# Patient Record
Sex: Male | Born: 1965 | Race: Black or African American | Hispanic: No | Smoking: Never smoker
Health system: Southern US, Community
[De-identification: ages and names within clinical notes are randomized; demographics above are authoritative.]

## PROBLEM LIST (undated history)

## (undated) DIAGNOSIS — R413 Other amnesia: Secondary | ICD-10-CM

## (undated) DIAGNOSIS — R030 Elevated blood-pressure reading, without diagnosis of hypertension: Secondary | ICD-10-CM

## (undated) DIAGNOSIS — I10 Essential (primary) hypertension: Secondary | ICD-10-CM

## (undated) DIAGNOSIS — T7840XA Allergy, unspecified, initial encounter: Secondary | ICD-10-CM

## (undated) DIAGNOSIS — H04123 Dry eye syndrome of bilateral lacrimal glands: Secondary | ICD-10-CM

## (undated) DIAGNOSIS — E538 Deficiency of other specified B group vitamins: Secondary | ICD-10-CM

## (undated) DIAGNOSIS — R001 Bradycardia, unspecified: Secondary | ICD-10-CM

## (undated) DIAGNOSIS — E039 Hypothyroidism, unspecified: Secondary | ICD-10-CM

## (undated) DIAGNOSIS — E785 Hyperlipidemia, unspecified: Secondary | ICD-10-CM

## (undated) DIAGNOSIS — D649 Anemia, unspecified: Secondary | ICD-10-CM

## (undated) DIAGNOSIS — J029 Acute pharyngitis, unspecified: Secondary | ICD-10-CM

## (undated) DIAGNOSIS — E509 Vitamin A deficiency, unspecified: Secondary | ICD-10-CM

## (undated) HISTORY — DX: Bradycardia, unspecified: R00.1

## (undated) HISTORY — DX: Allergy, unspecified, initial encounter: T78.40XA

## (undated) HISTORY — DX: Other amnesia: R41.3

## (undated) HISTORY — DX: Hypothyroidism, unspecified: E03.9

## (undated) HISTORY — PX: BACK SURGERY: SHX140

## (undated) HISTORY — DX: Vitamin a deficiency, unspecified: E50.9

## (undated) HISTORY — DX: Elevated blood-pressure reading, without diagnosis of hypertension: R03.0

## (undated) HISTORY — DX: Deficiency of other specified B group vitamins: E53.8

## (undated) HISTORY — DX: Anemia, unspecified: D64.9

## (undated) HISTORY — DX: Hyperlipidemia, unspecified: E78.5

## (undated) HISTORY — DX: Acute pharyngitis, unspecified: J02.9

## (undated) HISTORY — DX: Dry eye syndrome of bilateral lacrimal glands: H04.123

---

## 1998-03-31 ENCOUNTER — Encounter: Payer: Self-pay | Admitting: Emergency Medicine

## 1998-03-31 ENCOUNTER — Emergency Department (HOSPITAL_COMMUNITY): Admission: EM | Admit: 1998-03-31 | Discharge: 1998-03-31 | Payer: Self-pay | Admitting: Emergency Medicine

## 1998-04-01 ENCOUNTER — Emergency Department (HOSPITAL_COMMUNITY): Admission: EM | Admit: 1998-04-01 | Discharge: 1998-04-01 | Payer: Self-pay | Admitting: Internal Medicine

## 1998-04-04 ENCOUNTER — Emergency Department (HOSPITAL_COMMUNITY): Admission: EM | Admit: 1998-04-04 | Discharge: 1998-04-04 | Payer: Self-pay | Admitting: Emergency Medicine

## 1999-03-19 ENCOUNTER — Emergency Department (HOSPITAL_COMMUNITY): Admission: EM | Admit: 1999-03-19 | Discharge: 1999-03-19 | Payer: Self-pay | Admitting: Emergency Medicine

## 1999-03-24 ENCOUNTER — Emergency Department (HOSPITAL_COMMUNITY): Admission: EM | Admit: 1999-03-24 | Discharge: 1999-03-24 | Payer: Self-pay | Admitting: Emergency Medicine

## 1999-09-01 ENCOUNTER — Ambulatory Visit (HOSPITAL_COMMUNITY): Admission: RE | Admit: 1999-09-01 | Discharge: 1999-09-01 | Payer: Self-pay | Admitting: *Deleted

## 1999-10-23 ENCOUNTER — Emergency Department (HOSPITAL_COMMUNITY): Admission: EM | Admit: 1999-10-23 | Discharge: 1999-10-23 | Payer: Self-pay | Admitting: *Deleted

## 1999-11-13 ENCOUNTER — Emergency Department (HOSPITAL_COMMUNITY): Admission: EM | Admit: 1999-11-13 | Discharge: 1999-11-13 | Payer: Self-pay | Admitting: Emergency Medicine

## 2008-01-15 ENCOUNTER — Ambulatory Visit (HOSPITAL_COMMUNITY): Admission: RE | Admit: 2008-01-15 | Discharge: 2008-01-16 | Payer: Self-pay | Admitting: Neurosurgery

## 2009-06-23 ENCOUNTER — Encounter: Admission: RE | Admit: 2009-06-23 | Discharge: 2009-06-23 | Payer: Self-pay | Admitting: Internal Medicine

## 2010-06-13 NOTE — Op Note (Signed)
Steven Morales, Steven Morales NO.:  [id number]   MEDICAL RECORD NO.:  [id number]          PATIENT TYPE:  OIB   LOCATION:  3523                         FACILITY:  [facility]   PHYSICIAN:  Reinaldo Meeker, M.D. DATE OF BIRTH:  02-04-1965   DATE OF PROCEDURE:  01/15/2008  DATE OF DISCHARGE:                               OPERATIVE REPORT   PREOPERATIVE DIAGNOSIS:  Herniated disk L4-L5, right.   POSTOPERATIVE DIAGNOSIS:  Herniated disk L4-L5, right.   PROCEDURE:  Right L4-L5 intralaminar laminotomy for excision of  herniated disk with operative microscope.   SECONDARY PROCEDURE:  Microdissection L4-L5 disk and L5 nerve roots.   SURGEON:  Reinaldo Meeker, MD   ASSISTANT:  Tia Alert, MD   PROCEDURE IN DETAIL:  After placing in the prone position, the patient's  back was prepped and draped in the usual sterile fashion.  Localizing x-  rays taken prior to incision to identify the appropriate level.  A  midline incision was made above the spinous processes of L4 and L5.  Using Bovie cutting current, an incision was carried to the spinous  processes.  Subperiosteal dissection was then carried out on the right  side of the spinous processes and lamina.  Self-retaining retractor was  placed for exposure.  X-ray showed we were at the appropriate level.  Using high speed drill, the inferior one-third of the L4 lamina, the  medial one-third of facet joint and superior one-half of the L5 lamina  were removed.  Residual bone and ligamentum flavum were removed in a  piecemeal fashion.  The microscope was draped and brought into the field  and used for remainder of the case.  Using microdissection technique,  the lateral aspect of the thecal sac and L5 nerve were identified.  Bipolar coagulation was carried out down the  canal to identify the L4-  L5 disk, which was found to be markedly herniated with a sublingamentous  inferior fragment.  The fragment was additionally removed and  the  disk  space incised with a 15 blade.  Using pituitary rongeurs and curettes,  thorough disk space clean-up was carried out __________.  Great care was  taken to avoid injury to the neural elements.  This was successfully  done.  At this time, inspection was carried out in all directions  without any evidence of residual compression and none could be  identified.  Large amounts of irrigation was carried out and any  bleeding controlled with bipolar coagulation and Gelfoam.  The wound was  then closed with multiple layers of Vicryl in the muscle fascia,  subcutaneous subcuticular tissues, and staples were placed on the skin.  A sterile dressing was then applied, and the patient was extubated and  taken to recovery room in stable condition.           ______________________________  Reinaldo Meeker, M.D.     ROK/MEDQ  D:  01/15/2008  T:  01/16/2008  Job:  [id number]

## 2010-06-16 NOTE — Op Note (Signed)
[place]  Patient:    Steven Morales, Steven Morales                       MRN: [id number] Proc. Date: 09/01/99 Adm. Date:  [id number] Disc. Date: [id number] Attending:  Sharyn Dross                           Operative Report  PREOPERATIVE DIAGNOSIS:  Dysphagia.  POSTOPERATIVE DIAGNOSIS: 1. Diffuse gastritis appreciated. 2. Hyperemia of the esophagus but no other abnormalities noted.  PROCEDURE PERFORMED:  Esophagogastroduodenoscopy.  MEDICATIONS USED:  Demerol 50 mg IV, Versed 5 mg IV over a 10-minute period of time.  INSTRUMENT USED:  Olympus video panendoscope.  ENDOSCOPIST:  Sharyn Dross., M.D.  INDICATIONS FOR PROCEDURE:  This pleasant 45 year old African-American male was referred, came to the office for evaluation of difficulty swallowing.  He was initially placed on a proton pump inhibitor medication which appears to have helped.  The patient subsequently stopped the PPI medication with noticed recurrence of his symptoms at this time.  The patient now states that his difficulties with swallowing is high up in the chest region at this time. There is no history of any hematemesis, melena or hematochezias noted.  OBJECTIVE FINDINGS:  He is a pleasant gentleman who appears to be in no acute distress today.  His vital signs are stable.  His HEENT examination is anicteric.  Neck was supple.  Lungs were clear.  The heart had a regular rate and rhythm without heaves, thrills, murmurs or gallops.  The abdomen was soft with no tenderness to palpation, no hepatosplenomegaly that was noted. Digital rectal exam was deferred.  Extremities showed no clubbing, cyanosis or edema.  PLAN:  I am going to proceed with the endoscopic examination especially in light of the patients having recurrent symptoms soon after he stops his PPI at this time depending on the results to determine the course of therapy.  INFORMED CONSENT:  The patient was advised of the  procedure, indications, and the reason for wanting to do the procedure at this time.  The patient has agreed to have the procedure performed at this time.  A video was reviewed and the consent form was obtained.  PREOPERATIVE PREPARATION:  The patient was brought to the endoscopy unit where an IV for IV conscious sedating medication was started.  A monitor was placed on the patient to monitor the patients vital signs and oxygen saturation. Nasal oxygen at 2 L per minute was used and after adequate sedation was performed, the procedure was begun.  DESCRIPTION OF PROCEDURE:  The instrument was advanced with the patient in the left lateral position via the direct technique without difficulty.  The oropharyngeal epliglottis, vocal cords and piriform sinuses appeared to be grossly within normal limits. The esophagus was normal without any evidence of acute inflammation, ulcerations, hiatal hernias or varices appreciated.  The gastric area showed a normal mucous lake with evidence of diffuse gastritis changes that was present at this time.  The mucosa appeared to be hyperemic, possibly secondary to video technology from that standpoint. However, there were linear streaks that was present in the antral area which were more reddened than the normal contour that was present.  The instrument was attempted to advance into the pylorus with initially some unsuccessfulness that was noted.  Upon continued attempts, the mucosal area that was noted in the adjacent region appeared to be very  friable that was noted.  The instrument was subsequently advanced into the pylorus and the duodenal bulb and second portion appeared to be within normal limits.  Retroflex view of the cardia showed still evidence of inflammation that was noted throughout the region.  The Z-line appeared to be approximately 40 cm.  The instrument was retracted back and removed per orum without any abnormalities noted.  TREATMENT: 1.  I am going to start the patient back with his Aciphex 20 mg on a daily    basis at this time. 2. Will have follow-up in the office with me. 3. Biopsies were not obtained at this time due to the patients slight    agitation and the process and difficulty in maintaining him from this    standpoint. 4. If the patient still shows evidence of difficulty in responding, may need    to add either a prokinetic for increased emptying or a medication to help    by coating the inflammatory process that was noted.  Depending on the    results to determine the course of therapy. DD:  09/01/99 TD:  09/04/99 Job: [id number]

## 2010-11-03 LAB — BASIC METABOLIC PANEL
BUN: 8 mg/dL (ref 6–23)
CO2: 27 mEq/L (ref 19–32)
Calcium: 9.6 mg/dL (ref 8.4–10.5)
Chloride: 104 mEq/L (ref 96–112)
Creatinine, Ser: 0.83 mg/dL (ref 0.4–1.5)
GFR calc Af Amer: >60 mL/min (ref >60)
GFR calc non Af Amer: >60 mL/min (ref >60)
Glucose, Bld: 92 mg/dL (ref 70–99)
Potassium: 4.9 mEq/L (ref 3.5–5.1)
Sodium: 138 mEq/L (ref 135–145)

## 2010-11-03 LAB — CBC
HCT: 43 % (ref 39.0–52.0)
Platelets: 237 10*3/uL (ref 150–400)
RDW: 12.4 % (ref 11.5–15.5)

## 2011-01-25 ENCOUNTER — Ambulatory Visit: Payer: Self-pay

## 2011-01-25 DIAGNOSIS — R21 Rash and other nonspecific skin eruption: Secondary | ICD-10-CM

## 2011-01-25 DIAGNOSIS — Z23 Encounter for immunization: Secondary | ICD-10-CM

## 2011-01-25 DIAGNOSIS — Z0000 Encounter for general adult medical examination without abnormal findings: Secondary | ICD-10-CM

## 2011-04-13 ENCOUNTER — Telehealth: Payer: Self-pay

## 2011-04-13 DIAGNOSIS — E0590 Thyrotoxicosis, unspecified without thyrotoxic crisis or storm: Secondary | ICD-10-CM

## 2011-04-13 NOTE — Telephone Encounter (Signed)
Pt was going to be referred to [facility] but he did not have the money at the time he does have the  Money now and would like to go ahead with the referral

## 2011-04-14 NOTE — Telephone Encounter (Signed)
Spoke with pt and advised that Dr would have to review first and then we would get back in touch with him. Chart is in [employer].

## 2011-07-04 ENCOUNTER — Other Ambulatory Visit (HOSPITAL_COMMUNITY): Payer: Self-pay | Admitting: Endocrinology

## 2011-07-04 DIAGNOSIS — E0590 Thyrotoxicosis, unspecified without thyrotoxic crisis or storm: Secondary | ICD-10-CM

## 2011-07-05 ENCOUNTER — Other Ambulatory Visit (HOSPITAL_COMMUNITY): Payer: Self-pay | Admitting: Endocrinology

## 2011-07-05 DIAGNOSIS — E0590 Thyrotoxicosis, unspecified without thyrotoxic crisis or storm: Secondary | ICD-10-CM

## 2011-07-10 ENCOUNTER — Ambulatory Visit (HOSPITAL_COMMUNITY): Payer: Self-pay

## 2011-07-11 ENCOUNTER — Other Ambulatory Visit (HOSPITAL_COMMUNITY): Payer: Self-pay

## 2011-07-12 ENCOUNTER — Encounter (HOSPITAL_COMMUNITY)
Admission: RE | Admit: 2011-07-12 | Discharge: 2011-07-12 | Disposition: A | Discharge status: Home / Self Care | Payer: Self-pay | Source: Ambulatory Visit | Attending: Endocrinology | Admitting: Endocrinology

## 2011-07-12 DIAGNOSIS — E0590 Thyrotoxicosis, unspecified without thyrotoxic crisis or storm: Secondary | ICD-10-CM | POA: Insufficient documentation

## 2011-07-13 ENCOUNTER — Encounter (HOSPITAL_COMMUNITY)
Admission: RE | Admit: 2011-07-13 | Discharge: 2011-07-13 | Disposition: A | Discharge status: Home / Self Care | Payer: Self-pay | Source: Ambulatory Visit | Attending: Endocrinology | Admitting: Endocrinology

## 2011-07-13 MED ORDER — SODIUM IODIDE I 131 CAPSULE
13.0300 | Freq: Once | INTRAVENOUS | Status: AC | PRN
  Administered 2011-07-12: 13.03 via ORAL

## 2011-07-13 MED ORDER — SODIUM PERTECHNETATE TC 99M INJECTION
10.0000 | Freq: Once | INTRAVENOUS | Status: AC | PRN
  Administered 2011-07-13: 10 via INTRAVENOUS

## 2011-07-20 ENCOUNTER — Encounter (HOSPITAL_COMMUNITY)
Admission: RE | Admit: 2011-07-20 | Discharge: 2011-07-20 | Disposition: A | Discharge status: Home / Self Care | Payer: Self-pay | Source: Ambulatory Visit | Attending: Endocrinology | Admitting: Endocrinology

## 2011-07-20 DIAGNOSIS — E0590 Thyrotoxicosis, unspecified without thyrotoxic crisis or storm: Secondary | ICD-10-CM | POA: Insufficient documentation

## 2011-07-20 MED ORDER — SODIUM IODIDE I 131 CAPSULE
21.4000 | Freq: Once | INTRAVENOUS | Status: AC | PRN
  Administered 2011-07-20: 21.4 via ORAL

## 2012-03-17 ENCOUNTER — Emergency Department (HOSPITAL_COMMUNITY): Payer: 59

## 2012-03-17 ENCOUNTER — Encounter (HOSPITAL_COMMUNITY): Payer: Self-pay | Admitting: Emergency Medicine

## 2012-03-17 ENCOUNTER — Emergency Department (HOSPITAL_COMMUNITY)
Admission: EM | Admit: 2012-03-17 | Discharge: 2012-03-17 | Disposition: A | Discharge status: Home / Self Care | Payer: 59 | Attending: Emergency Medicine | Admitting: Emergency Medicine

## 2012-03-17 DIAGNOSIS — S161XXA Strain of muscle, fascia and tendon at neck level, initial encounter: Secondary | ICD-10-CM

## 2012-03-17 DIAGNOSIS — S39012A Strain of muscle, fascia and tendon of lower back, initial encounter: Secondary | ICD-10-CM

## 2012-03-17 DIAGNOSIS — S139XXA Sprain of joints and ligaments of unspecified parts of neck, initial encounter: Secondary | ICD-10-CM | POA: Insufficient documentation

## 2012-03-17 DIAGNOSIS — I10 Essential (primary) hypertension: Secondary | ICD-10-CM | POA: Insufficient documentation

## 2012-03-17 DIAGNOSIS — Y9389 Activity, other specified: Secondary | ICD-10-CM | POA: Insufficient documentation

## 2012-03-17 DIAGNOSIS — S335XXA Sprain of ligaments of lumbar spine, initial encounter: Secondary | ICD-10-CM | POA: Insufficient documentation

## 2012-03-17 DIAGNOSIS — Y92410 Unspecified street and highway as the place of occurrence of the external cause: Secondary | ICD-10-CM | POA: Insufficient documentation

## 2012-03-17 HISTORY — DX: Essential (primary) hypertension: I10

## 2012-03-17 MED ORDER — CYCLOBENZAPRINE HCL 10 MG PO TABS: 10.0000 mg | ORAL_TABLET | Freq: Three times a day (TID) | ORAL | Status: DC | PRN

## 2012-03-17 MED ORDER — IBUPROFEN 800 MG PO TABS: 800.0000 mg | ORAL_TABLET | Freq: Three times a day (TID) | ORAL | Status: DC | PRN

## 2012-03-17 MED ORDER — IBUPROFEN 400 MG PO TABS
800.0000 mg | ORAL_TABLET | Freq: Once | ORAL | Status: AC
  Administered 2012-03-17: 800 mg via ORAL
  Filled 2012-03-17: qty 2
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
## 2012-03-17 NOTE — ED Notes (Signed)
Pt transported to radiology.
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
## 2012-03-17 NOTE — ED Notes (Signed)
Pt returned from radiology.

## 2012-03-17 NOTE — ED Notes (Signed)
C-collar applied in triage.
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
## 2013-02-23 ENCOUNTER — Ambulatory Visit: Payer: 59

## 2013-02-23 ENCOUNTER — Ambulatory Visit (INDEPENDENT_AMBULATORY_CARE_PROVIDER_SITE_OTHER): Payer: 59 | Admitting: Emergency Medicine

## 2013-02-23 VITALS — BP 138/80 | HR 55 | Temp 98.1°F | Resp 16 | Ht 69.75 in | Wt 182.0 lb

## 2013-02-23 DIAGNOSIS — M549 Dorsalgia, unspecified: Secondary | ICD-10-CM

## 2013-02-23 MED ORDER — MELOXICAM 7.5 MG PO TABS: ORAL_TABLET | ORAL | Status: DC
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
## 2013-02-27 ENCOUNTER — Telehealth: Payer: Self-pay

## 2013-02-27 DIAGNOSIS — Z0271 Encounter for disability determination: Secondary | ICD-10-CM

## 2013-02-27 NOTE — Telephone Encounter (Signed)
Pts disability ppw has been completed by Dr.Daub, called pt to get 15 payment for forms to be completed but the voicemail was full. ppw will be up front for pick-up but payment will be needed.  Called [phone]

## 2013-04-13 ENCOUNTER — Ambulatory Visit (INDEPENDENT_AMBULATORY_CARE_PROVIDER_SITE_OTHER): Payer: 59 | Admitting: Physician Assistant

## 2013-04-13 ENCOUNTER — Telehealth: Payer: Self-pay

## 2013-04-13 VITALS — BP 124/80 | HR 46 | Temp 98.0°F | Resp 17 | Ht 70.5 in | Wt 183.0 lb

## 2013-04-13 DIAGNOSIS — T2200XA Burn of unspecified degree of shoulder and upper limb, except wrist and hand, unspecified site, initial encounter: Secondary | ICD-10-CM

## 2013-04-13 DIAGNOSIS — J069 Acute upper respiratory infection, unspecified: Secondary | ICD-10-CM

## 2013-04-13 DIAGNOSIS — B9789 Other viral agents as the cause of diseases classified elsewhere: Secondary | ICD-10-CM

## 2013-04-13 DIAGNOSIS — L84 Corns and callosities: Secondary | ICD-10-CM

## 2013-04-13 MED ORDER — IPRATROPIUM BROMIDE 0.03 % NA SOLN: 2.0000 | Freq: Two times a day (BID) | NASAL | Status: DC

## 2013-04-13 MED ORDER — SILVER SULFADIAZINE 1 % EX CREA: 1.0000 "application " | TOPICAL_CREAM | Freq: Every day | CUTANEOUS | Status: DC

## 2013-04-13 MED ORDER — NAPROXEN 500 MG PO TBEC: 500.0000 mg | DELAYED_RELEASE_TABLET | Freq: Two times a day (BID) | ORAL | Status: DC

## 2013-04-13 MED ORDER — HYDROCODONE-HOMATROPINE 5-1.5 MG/5ML PO SYRP: 5.0000 mL | ORAL_SOLUTION | Freq: Three times a day (TID) | ORAL | Status: DC | PRN

## 2013-04-13 MED ORDER — BENZONATATE 100 MG PO CAPS: 100.0000 mg | ORAL_CAPSULE | Freq: Three times a day (TID) | ORAL | Status: DC | PRN

## 2013-04-13 NOTE — Telephone Encounter (Signed)
Forms have been returned to the disability box, a copy has been made and the original is in the p/u drawer.

## 2013-04-13 NOTE — Patient Instructions (Signed)
Use the silvadene cream on your arm once daily until healed  Take the Naprosyn twice daily with food for your toe pain  Use the Atrovent nasal spray 2-3 times daily for your runny nose  Use the Tessalon Perles every 8 hours as needed for cough  Use the Hycodan cough syrup at bed time if needed - will make you sleepy.  This medicine has hydrocodone in it so will relieve pain as well  Drink plenty of water and get plenty of rest  You will get a phone call about your appointment with the foot doctor (podiatrist)  Please let us know if you are worsening or not improving   Burn Care Your skin is a natural barrier to infection. It is the largest organ of your body. Burns damage this natural protection. To help prevent infection, it is very important to follow your caregiver's instructions in the care of your burn. Burns are classified as:  First degree. There is only redness of the skin (erythema). No scarring is expected.  Second degree. There is blistering of the skin. Scarring may occur with deeper burns.  Third degree. All layers of the skin are injured, and scarring is expected. HOME CARE INSTRUCTIONS   Wash your hands well before changing your bandage.  Change your bandage as often as directed by your caregiver.  Remove the old bandage. If the bandage sticks, you may soak it off with cool, clean water.  Cleanse the burn thoroughly but gently with mild soap and water.  Pat the area dry with a clean, dry cloth.  Apply a thin layer of antibacterial cream to the burn.  Apply a clean bandage as instructed by your caregiver.  Keep the bandage as clean and dry as possible.  Elevate the affected area for the first 24 hours, then as instructed by your caregiver.  Only take over-the-counter or prescription medicines for pain, discomfort, or fever as directed by your caregiver. SEEK IMMEDIATE MEDICAL CARE IF:   You develop excessive pain.  You develop redness, tenderness,  swelling, or red streaks near the burn.  The burned area develops yellowish-white fluid (pus) or a bad smell.  You have a fever. MAKE SURE YOU:   Understand these instructions.  Will watch your condition.  Will get help right away if you are not doing well or get worse. Document Released: 01/15/2005 Document Revised: 04/09/2011 Document Reviewed: 06/07/2010 [facility] Patient Information 2014 [place], [place].
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
## 2013-04-13 NOTE — Telephone Encounter (Signed)
Pt brought in immigration forms Dr Everlene Farrier filled out recently. The pt states the immigration office told him the forms were missing information. They did not tell him specifically what, but instead highlighted a section on the instructions referencing a video. The instructions ask the Dr to watch the video to help them complete the forms.   The forms are in [facility] box.   Pt best: [phone] and [phone]  bf

## 2013-05-05 ENCOUNTER — Ambulatory Visit (INDEPENDENT_AMBULATORY_CARE_PROVIDER_SITE_OTHER): Payer: 59

## 2013-05-05 ENCOUNTER — Encounter: Payer: Self-pay | Admitting: Podiatry

## 2013-05-05 ENCOUNTER — Ambulatory Visit (INDEPENDENT_AMBULATORY_CARE_PROVIDER_SITE_OTHER): Payer: 59 | Admitting: Podiatry

## 2013-05-05 VITALS — BP 154/94 | HR 60 | Resp 18 | Ht 72.0 in | Wt 175.0 lb

## 2013-05-05 DIAGNOSIS — M79609 Pain in unspecified limb: Secondary | ICD-10-CM

## 2013-05-05 DIAGNOSIS — M79671 Pain in right foot: Secondary | ICD-10-CM

## 2013-05-05 DIAGNOSIS — M7750 Other enthesopathy of unspecified foot: Secondary | ICD-10-CM

## 2013-05-05 DIAGNOSIS — M79672 Pain in left foot: Principal | ICD-10-CM

## 2013-05-05 DIAGNOSIS — M2040 Other hammer toe(s) (acquired), unspecified foot: Secondary | ICD-10-CM

## 2013-05-05 DIAGNOSIS — M779 Enthesopathy, unspecified: Secondary | ICD-10-CM

## 2013-05-05 DIAGNOSIS — M778 Other enthesopathies, not elsewhere classified: Secondary | ICD-10-CM

## 2013-05-05 NOTE — Progress Notes (Signed)
   Subjective:    Patient ID: Keylon Labelle, male    DOB: 03-01-1965, 48 y.o.   MRN: [id number]  HPI Comments: Theses on my toes are very painful. Both 5th toes corns. Normally cut self it keeps coming back      Review of Systems  All other systems reviewed and are negative.       Objective:   Physical Exam: I have reviewed his past medical history medications allergies surgeries social history and review of systems. Pulses are palpable bilateral. Neurologic sensorium is intact. 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 Reactive hyperkeratosis is also present to the dorsal aspect of the joint.        Assessment & Plan:  Assessment: Hammertoe deformity with bursitis and capsulitis fifth digit bilateral.  Plan: Debridement reactive hyperkeratosis injected dexamethasone and local anesthetic to the point of maximal tenderness. And I will followup with him in one month.

## 2013-05-14 ENCOUNTER — Telehealth: Payer: Self-pay

## 2013-05-14 NOTE — Telephone Encounter (Signed)
[facility] came to [facility] and states that someone called him about 2 weeks ago and stated that his FMLA ppw was ready for pick up. I see a phone message from 04/13/2013 about this issue however nothing was in the pick up drawer for patient. I printed a copy of the old FMLA forms from January that were completed and gave to patient. Patient states this ppw is for immigration purposes and that something was to be corrected on it last time he brought it in. I told patient if any changes or additional information is needed to please bring ppw back and provide a clear explanation of what exactly needs to be updated.

## 2013-06-02 ENCOUNTER — Ambulatory Visit: Payer: 59 | Admitting: Podiatry

## 2013-06-11 ENCOUNTER — Encounter: Payer: Self-pay | Admitting: Podiatry

## 2013-06-11 ENCOUNTER — Ambulatory Visit (INDEPENDENT_AMBULATORY_CARE_PROVIDER_SITE_OTHER): Payer: 59 | Admitting: Podiatry

## 2013-06-11 VITALS — BP 143/84 | HR 60 | Resp 16

## 2013-06-11 DIAGNOSIS — M7750 Other enthesopathy of unspecified foot: Secondary | ICD-10-CM

## 2013-06-11 DIAGNOSIS — M79671 Pain in right foot: Secondary | ICD-10-CM

## 2013-06-11 DIAGNOSIS — M778 Other enthesopathies, not elsewhere classified: Secondary | ICD-10-CM

## 2013-06-11 DIAGNOSIS — M79609 Pain in unspecified limb: Secondary | ICD-10-CM

## 2013-06-11 DIAGNOSIS — M779 Enthesopathy, unspecified: Secondary | ICD-10-CM

## 2013-06-11 DIAGNOSIS — M79672 Pain in left foot: Principal | ICD-10-CM

## 2013-06-11 NOTE — Progress Notes (Signed)
He presents today complaining of painful fifth hammertoe deformities bilateral. He states that they were doing great for a few days and then the pain came back.  Objective: Vital signs are stable he is alert and oriented x3. He presents today in a tight pair of tennis shoes. Adductovarus rotated hammertoe deformities with overlying bursitis and reactive hyperkeratosis is noted.  Assessment: Capsulitis with hammertoe deformities and bursitis fifth bilateral.  Plan: Injected dexamethasone to the versus today bilateral. I also debrided her reactive hyperkeratosis. And to the best of my ability I explained and discussed the premis behind wider shoes. I think he understood this. I will followup with him in a month or so. We may need to discuss surgery at that point.

## 2013-07-23 ENCOUNTER — Encounter: Payer: Self-pay | Admitting: Podiatry

## 2013-07-23 ENCOUNTER — Ambulatory Visit (INDEPENDENT_AMBULATORY_CARE_PROVIDER_SITE_OTHER): Payer: 59 | Admitting: Podiatry

## 2013-07-23 DIAGNOSIS — Q828 Other specified congenital malformations of skin: Secondary | ICD-10-CM

## 2013-07-23 DIAGNOSIS — M2040 Other hammer toe(s) (acquired), unspecified foot: Secondary | ICD-10-CM

## 2013-07-23 NOTE — Progress Notes (Signed)
He presents today complaining of his hammertoe deformities and pain to the fifth digits.  Objective: Vital signs are stable he is alert and oriented x3 pulses are palpable bilateral. No longer has pain for we injected him he now has pain to the lateral aspect of the toenail fifth digit bilaterally. He has adductovarus rotated hammertoe resulting in reactive hyperkeratosis to the lateral nail fold. He is exquisitely painful and more than likely associated with spurs.  Assessment: Hammertoe deformity and reactive hyperkeratosis fifth digit bilateral.  Plan: Debridement of reactive hyperkeratosis discussed appropriate shoe gear stretching exercises ice therapy shoe modifications and surgical therapy.

## 2013-09-03 ENCOUNTER — Ambulatory Visit: Payer: 59 | Admitting: Podiatry

## 2013-11-25 ENCOUNTER — Ambulatory Visit (INDEPENDENT_AMBULATORY_CARE_PROVIDER_SITE_OTHER): Payer: 59 | Admitting: Diagnostic Neuroimaging

## 2013-11-25 ENCOUNTER — Encounter: Payer: Self-pay | Admitting: Diagnostic Neuroimaging

## 2013-11-25 VITALS — BP 136/84 | HR 45 | Ht 70.0 in | Wt 188.2 lb

## 2013-11-25 DIAGNOSIS — R480 Dyslexia and alexia: Secondary | ICD-10-CM

## 2013-11-25 DIAGNOSIS — S069X5A Unspecified intracranial injury with loss of consciousness greater than 24 hours with return to pre-existing conscious level, initial encounter: Secondary | ICD-10-CM

## 2013-11-25 NOTE — Patient Instructions (Signed)
I will check MRI brain and refer your to speech / language therapy.
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
## 2013-11-29 ENCOUNTER — Other Ambulatory Visit: Payer: 59

## 2014-01-06 ENCOUNTER — Ambulatory Visit: Payer: 59 | Admitting: Diagnostic Neuroimaging

## 2014-06-24 ENCOUNTER — Ambulatory Visit (INDEPENDENT_AMBULATORY_CARE_PROVIDER_SITE_OTHER): Payer: No Typology Code available for payment source | Admitting: Endocrinology

## 2014-06-24 ENCOUNTER — Encounter: Payer: Self-pay | Admitting: Endocrinology

## 2014-06-24 VITALS — BP 128/86 | HR 59 | Temp 98.1°F | Ht 70.0 in | Wt 186.0 lb

## 2014-06-24 DIAGNOSIS — E890 Postprocedural hypothyroidism: Secondary | ICD-10-CM | POA: Insufficient documentation

## 2014-06-24 NOTE — Progress Notes (Signed)
Subjective:    Patient ID: Steven Morales, male    DOB: 08/25/1965, 49 y.o.   MRN: [id number]  HPI In 2013, pt had I-131 rx for hyperthyroidism due to [place] dz.  he has been on synthroid since soon thereafter.  He now reports moderate pain throughout the body, and assoc dry skin.  Based on recent lab result, synthroid was increased to 200 mcg/d, 2 weeks ago. Since then, he feels no different.   Past Medical History  Diagnosis Date  . Hypertension   . Memory loss   . Hypothyroidism   . Hyperlipidemia     Past Surgical History  Procedure Laterality Date  . Back surgery      History   Social History  . Marital Status: Divorced    Spouse Name: N/A  . Number of Children: 6  . Years of Education: HS   Occupational History  .      [employer]   Social History Main Topics  . Smoking status: Never Smoker   . Smokeless tobacco: Never Used  . Alcohol Use: No  . Drug Use: No  . Sexual Activity: Not on file   Other Topics Concern  . Not on file   Social History Narrative   Patient lives at home with his spouse.   Caffeine Use: 2-3 cups daily    Current Outpatient Prescriptions on File Prior to Visit  Medication Sig Dispense Refill  . amLODipine (NORVASC) 5 MG tablet Take 5 mg by mouth daily.    . Cholecalciferol (VITAMIN D-3) 1000 UNITS CAPS Take 1 capsule by mouth daily.    . pravastatin (PRAVACHOL) 20 MG tablet Take 20 mg by mouth daily.    Marland Kitchen ibuprofen (ADVIL,MOTRIN) 200 MG tablet Take 200 mg by mouth every 6 (six) hours as needed.     No current facility-administered medications on file prior to visit.    No Known Allergies  Family History  Problem Relation Age of Onset  . Hypertension Father   . Thyroid disease Father     BP 128/86 mmHg  Pulse 59  Temp(Src) 98.1 F (36.7 C) (Oral)  Ht 5\' 10"  (1.778 m)  Wt 186 lb (84.369 kg)  BMI 26.69 kg/m2  SpO2 97%  Review of Systems denies depression, hair loss, sob, weight gain, constipation, numbness, blurry  vision, cold intolerance, easy bruising, and syncope.  He has occasional leg cramps, rash, and rhinorrhea.     Objective:   Physical Exam VS: see vs page GEN: no distress HEAD: head: no deformity eyes: no periorbital swelling, no proptosis external nose and ears are normal mouth: no lesion seen NECK: supple, thyroid is not enlarged CHEST WALL: no deformity LUNGS: clear to auscultation BREASTS:  No gynecomastia CV: reg rate and rhythm, no murmur ABD: abdomen is soft, nontender.  no hepatosplenomegaly.  not distended.  no hernia MUSCULOSKELETAL: muscle bulk and strength are grossly normal.  no obvious joint swelling.  gait is normal and steady EXTEMITIES: no deformity.  no edema. PULSES: no carotid bruit. NEURO:  cn 2-12 grossly intact.   readily moves all 4's.  sensation is intact to touch on all 4's. SKIN:  Normal texture and temperature.  No rash or suspicious lesion is visible.   NODES:  None palpable at the neck.  PSYCH: alert, well-oriented.  Does not appear anxious nor depressed.   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   Patient is advised the following: Patient Instructions  Please continue the same thyroid medication. Please redo the blood test here in the office, in 2-3 weeks.        Levothyroxine oral capsules What is this medicine? LEVOTHYROXINE (lee voe thye ROX een) is a thyroid hormone. This medicine can improve symptoms of thyroid deficiency such as slow speech, lack of energy, weight gain, hair loss, dry skin, and feeling cold. It also helps to treat goiter (an enlarged thyroid gland). It is also used to treat some kinds of thyroid cancer along with surgery and other medicines. This medicine may be used for other purposes; ask your health care provider or pharmacist if you have questions. COMMON BRAND NAME(S): Tirosint What  should I tell my health care provider before I take this medicine? They need to know if you have any of these conditions: -angina -blood clotting problems -diabetes -dieting or on a weight loss program -fertility problems -heart disease -high levels of thyroid hormone -pituitary gland problem -previous heart attack -an unusual or allergic reaction to levothyroxine, thyroid hormones, other medicines, foods, dyes, or preservatives -pregnant or trying to get pregnant -breast-feeding How should I use this medicine? Take this medicine by mouth with a glass of water. It is best to take on an empty stomach, at least 30 minutes to one hour before breakfast. Avoid taking antacids containing aluminum or magnesium, simethicone, bile acid sequestrants, calcium carbonate, sodium polystyrene sulfonate, ferrous sulfate, and sucralfate within 4 hours of taking this medicine. Do not cut, crush or chew this medicine. Follow the directions on the prescription label. Take at the same time each day. Do not take your medicine more often than directed. Talk to your pediatrician regarding the use of this medicine in children. While this drug may be prescribed for selected conditions, precautions do apply. Since the capsules cannot be crushed or placed in water, they may only be given to infants and children who are able to swallow an intact capsule. Overdosage: If you think you've taken too much of this medicine contact a poison control center or emergency room at once. Overdosage: If you think you have taken too much of this medicine contact a poison control center or emergency room at once. NOTE: This medicine is only for you. Do not share this medicine with others. What if I miss a dose? If you miss a dose, take it as soon as you can. If it is almost time for your next dose, take only that dose. Do not take double or extra doses. What may interact with this medicine? -amiodarone -antacids -anti-thyroid  medicines -calcium supplements -carbamazepine -cholestyramine -colestipol -digoxin -male hormones, including contraceptive or birth control pills -iron supplements -ketamine -liquid nutrition products like Ensure -medicines for colds and breathing difficulties -medicines for diabetes -medicines for mental depression -medicines or herbals used to decrease weight or appetite -phenobarbital or other barbiturate medications -phenytoin -prednisone or other corticosteroids -rifabutin -rifampin -simethicone -sodium polystyrene sulfonate -soy isoflavones -sucralfate -theophylline -warfarin This list may not describe all possible interactions. Give your health care provider a list of all the medicines, herbs, non-prescription drugs, or dietary supplements you use. Also tell them if you smoke, drink alcohol, or use illegal drugs. Some items may interact with your medicine. What should I watch for while using this medicine? Do not switch brands of this medicine unless your health care professional agrees with the change. Ask questions if you are uncertain. You will need regular exams  and occasional blood tests to check the response to treatment. If you are receiving this medicine for an underactive thyroid, it may be several weeks before you notice an improvement. Check with your doctor or health care professional if your symptoms do not improve. It may be necessary for you to take this medicine for the rest of your life. Do not stop using this medicine unless your doctor or health care professional advises you to. This medicine can affect blood sugar levels. If you have diabetes, check your blood sugar as directed. You may lose some of your hair when you first start treatment. With time, this usually corrects itself. If you are going to have surgery, tell your doctor or health care professional that you are taking this medicine. What side effects may I notice from receiving this  medicine? Side effects that you should report to your doctor or health care professional as soon as possible: -allergic reactions like skin rash, itching or hives, swelling of the face, lips, or tongue -chest pain -excessive sweating or intolerance to heat -fast or irregular heartbeat -nervousness -swelling of ankles, feet, or legs -tremors Side effects that usually do not require medical attention (Report these to your doctor or health care professional if they continue or are bothersome.): -changes in appetite -changes in menstrual periods -diarrhea -hair loss -headache -trouble sleeping -weight loss This list may not describe all possible side effects. Call your doctor for medical advice about side effects. You may report side effects to FDA at 1-800-FDA-1088. Where should I keep my medicine? Keep out of the reach of children. Store at room temperature between 15 and 30 degrees C (59 and 86 degrees F). Protect from light and moisture. Keep container tightly closed. Throw away any unused medicine after the expiration date. NOTE: This sheet is a summary. It may not cover all possible information. If you have questions about this medicine, talk to your doctor, pharmacist, or health care provider.  2015, Elsevier/Gold Standard. (2008-04-08 15:25:58)

## 2014-06-24 NOTE — Patient Instructions (Addendum)
Please continue the same thyroid medication. Please redo the blood test here in the office, in 2-3 weeks.        Levothyroxine oral capsules What is this medicine? LEVOTHYROXINE (lee voe thye ROX een) is a thyroid hormone. This medicine can improve symptoms of thyroid deficiency such as slow speech, lack of energy, weight gain, hair loss, dry skin, and feeling cold. It also helps to treat goiter (an enlarged thyroid gland). It is also used to treat some kinds of thyroid cancer along with surgery and other medicines. This medicine may be used for other purposes; ask your health care provider or pharmacist if you have questions. COMMON BRAND NAME(S): Tirosint What should I tell my health care provider before I take this medicine? They need to know if you have any of these conditions: -angina -blood clotting problems -diabetes -dieting or on a weight loss program -fertility problems -heart disease -high levels of thyroid hormone -pituitary gland problem -previous heart attack -an unusual or allergic reaction to levothyroxine, thyroid hormones, other medicines, foods, dyes, or preservatives -pregnant or trying to get pregnant -breast-feeding How should I use this medicine? Take this medicine by mouth with a glass of water. It is best to take on an empty stomach, at least 30 minutes to one hour before breakfast. Avoid taking antacids containing aluminum or magnesium, simethicone, bile acid sequestrants, calcium carbonate, sodium polystyrene sulfonate, ferrous sulfate, and sucralfate within 4 hours of taking this medicine. Do not cut, crush or chew this medicine. Follow the directions on the prescription label. Take at the same time each day. Do not take your medicine more often than directed. Talk to your pediatrician regarding the use of this medicine in children. While this drug may be prescribed for selected conditions, precautions do apply. Since the capsules cannot be crushed or placed in  water, they may only be given to infants and children who are able to swallow an intact capsule. Overdosage: If you think you've taken too much of this medicine contact a poison control center or emergency room at once. Overdosage: If you think you have taken too much of this medicine contact a poison control center or emergency room at once. NOTE: This medicine is only for you. Do not share this medicine with others. What if I miss a dose? If you miss a dose, take it as soon as you can. If it is almost time for your next dose, take only that dose. Do not take double or extra doses. What may interact with this medicine? -amiodarone -antacids -anti-thyroid medicines -calcium supplements -carbamazepine -cholestyramine -colestipol -digoxin -male hormones, including contraceptive or birth control pills -iron supplements -ketamine -liquid nutrition products like Ensure -medicines for colds and breathing difficulties -medicines for diabetes -medicines for mental depression -medicines or herbals used to decrease weight or appetite -phenobarbital or other barbiturate medications -phenytoin -prednisone or other corticosteroids -rifabutin -rifampin -simethicone -sodium polystyrene sulfonate -soy isoflavones -sucralfate -theophylline -warfarin This list may not describe all possible interactions. Give your health care provider a list of all the medicines, herbs, non-prescription drugs, or dietary supplements you use. Also tell them if you smoke, drink alcohol, or use illegal drugs. Some items may interact with your medicine. What should I watch for while using this medicine? Do not switch brands of this medicine unless your health care professional agrees with the change. Ask questions if you are uncertain. You will need regular exams and occasional blood tests to check the response to treatment. If you are receiving this  medicine for an underactive thyroid, it may be several weeks before  you notice an improvement. Check with your doctor or health care professional if your symptoms do not improve. It may be necessary for you to take this medicine for the rest of your life. Do not stop using this medicine unless your doctor or health care professional advises you to. This medicine can affect blood sugar levels. If you have diabetes, check your blood sugar as directed. You may lose some of your hair when you first start treatment. With time, this usually corrects itself. If you are going to have surgery, tell your doctor or health care professional that you are taking this medicine. What side effects may I notice from receiving this medicine? Side effects that you should report to your doctor or health care professional as soon as possible: -allergic reactions like skin rash, itching or hives, swelling of the face, lips, or tongue -chest pain -excessive sweating or intolerance to heat -fast or irregular heartbeat -nervousness -swelling of ankles, feet, or legs -tremors Side effects that usually do not require medical attention (Report these to your doctor or health care professional if they continue or are bothersome.): -changes in appetite -changes in menstrual periods -diarrhea -hair loss -headache -trouble sleeping -weight loss This list may not describe all possible side effects. Call your doctor for medical advice about side effects. You may report side effects to FDA at 1-800-FDA-1088. Where should I keep my medicine? Keep out of the reach of children. Store at room temperature between 15 and 30 degrees C (59 and 86 degrees F). Protect from light and moisture. Keep container tightly closed. Throw away any unused medicine after the expiration date. NOTE: This sheet is a summary. It may not cover all possible information. If you have questions about this medicine, talk to your doctor, pharmacist, or health care provider.  2015, Elsevier/Gold Standard. (2008-04-08  15:25:58)

## 2014-07-08 ENCOUNTER — Ambulatory Visit (INDEPENDENT_AMBULATORY_CARE_PROVIDER_SITE_OTHER): Payer: No Typology Code available for payment source | Admitting: Endocrinology

## 2014-07-08 ENCOUNTER — Encounter: Payer: Self-pay | Admitting: Endocrinology

## 2014-07-08 VITALS — BP 118/70 | HR 56 | Temp 98.0°F | Resp 12 | Wt 184.0 lb

## 2014-07-08 DIAGNOSIS — E890 Postprocedural hypothyroidism: Secondary | ICD-10-CM

## 2014-07-08 LAB — T4, FREE: FREE T4: 1.07 ng/dL (ref 0.60–1.60)

## 2014-07-08 LAB — TSH: TSH: 8.65 u[IU]/mL — ABNORMAL HIGH (ref 0.35–4.50)

## 2014-07-08 NOTE — Progress Notes (Signed)
   Subjective:    Patient ID: Steven Morales, male    DOB: 05-03-1965, 49 y.o.   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  He feels better in general, except for generalized myalgias.  Past Medical History  Diagnosis Date  . Hypertension   . Memory loss   . Hypothyroidism   . Hyperlipidemia     Past Surgical History  Procedure Laterality Date  . Back surgery      History   Social History  . Marital Status: Divorced    Spouse Name: N/A  . Number of Children: 6  . Years of Education: HS   Occupational History  .      [employer]   Social History Main Topics  . Smoking status: Never Smoker   . Smokeless tobacco: Never Used  . Alcohol Use: No  . Drug Use: No  . Sexual Activity: Not on file   Other Topics Concern  . Not on file   Social History Narrative   Patient lives at home with his spouse.   Caffeine Use: 2-3 cups daily    Current Outpatient Prescriptions on File Prior to Visit  Medication Sig Dispense Refill  . amLODipine (NORVASC) 5 MG tablet Take 5 mg by mouth daily.    . Cholecalciferol (VITAMIN D-3) 1000 UNITS CAPS Take 1 capsule by mouth daily.    Marland Kitchen etodolac (LODINE) 400 MG tablet Take 400 mg by mouth 2 (two) times daily.    Marland Kitchen HYDROcodone-acetaminophen (NORCO/VICODIN) 5-325 MG per tablet Take 1 tablet by mouth every 6 (six) hours as needed for moderate pain.    Marland Kitchen ibuprofen (ADVIL,MOTRIN) 200 MG tablet Take 200 mg by mouth every 6 (six) hours as needed.    Marland Kitchen levothyroxine (SYNTHROID, LEVOTHROID) 200 MCG tablet Take 200 mcg by mouth daily before breakfast.    . pravastatin (PRAVACHOL) 20 MG tablet Take 20 mg by mouth daily.     No current facility-administered medications on file prior to visit.    No Known Allergies  Family History  Problem Relation  Age of Onset  . Hypertension Father   . Thyroid disease Father     BP 118/70 mmHg  Pulse 56  Temp(Src) 98 F (36.7 C) (Oral)  Resp 12  Wt 184 lb (83.462 kg)  SpO2 97%  Review of Systems Denies fever    Objective:   Physical Exam VITAL SIGNS:  See vs page GENERAL: no distress.  NECK: There is no palpable thyroid enlargement.  No thyroid nodule is palpable.  No palpable lymphadenopathy at the anterior neck.  Lab Results  Component Value Date   TSH 8.65* 07/08/2014       Assessment & Plan:  Hypothyroidism: much better.   Patient is advised the following: Patient Instructions  blood tests are requested for you today.  We'll let you know about the results.  i don't know if the pain you have is related to the thyroid.  The first step is to get the thyroid normal, then see how you feel.   Please come back for a follow-up appointment in 3 months.    addendum: Please continue the same synthroid.  Recheck TSH 3 more weeks.

## 2014-07-08 NOTE — Patient Instructions (Signed)
blood tests are requested for you today.  We'll let you know about the results.  i don't know if the pain you have is related to the thyroid.  The first step is to get the thyroid normal, then see how you feel.   Please come back for a follow-up appointment in 3 months.

## 2014-10-08 ENCOUNTER — Telehealth: Payer: Self-pay | Admitting: Endocrinology

## 2014-10-08 ENCOUNTER — Ambulatory Visit: Payer: No Typology Code available for payment source | Admitting: Endocrinology

## 2014-10-08 NOTE — Telephone Encounter (Signed)
Baxter Flattery, Could you please contact this pt and reschedule this pt.

## 2014-10-08 NOTE — Telephone Encounter (Signed)
Follow up advised. Contact patient and schedule visit in 1 month.  

## 2014-10-08 NOTE — Telephone Encounter (Signed)
Patient no showed today's appt. Please advise on how to follow up. °A. No follow up necessary. °B. Follow up urgent. Contact patient immediately. °C. Follow up necessary. Contact patient and schedule visit in ___ days. °D. Follow up advised. Contact patient and schedule visit in ____weeks. ° °

## 2014-10-08 NOTE — Telephone Encounter (Signed)
I called adn left a message for pt to call back and make another apt

## 2014-11-23 ENCOUNTER — Telehealth: Payer: Self-pay | Admitting: Endocrinology

## 2014-11-23 NOTE — Telephone Encounter (Signed)
Pt states that since he had labs with [place] in June he has had pain in the arm the labs were drawn from

## 2014-11-23 NOTE — Telephone Encounter (Signed)
i would be happy to check this at [facility]

## 2014-11-23 NOTE — Telephone Encounter (Signed)
Patient scheduled for 11/29/2014 at 915 am.

## 2014-11-23 NOTE — Telephone Encounter (Signed)
See note below and please advise, Thanks! 

## 2014-11-29 ENCOUNTER — Ambulatory Visit (INDEPENDENT_AMBULATORY_CARE_PROVIDER_SITE_OTHER): Payer: No Typology Code available for payment source | Admitting: Endocrinology

## 2014-11-29 ENCOUNTER — Encounter: Payer: Self-pay | Admitting: Endocrinology

## 2014-11-29 VITALS — BP 126/72 | HR 58 | Temp 97.8°F | Resp 16 | Wt 186.2 lb

## 2014-11-29 DIAGNOSIS — E890 Postprocedural hypothyroidism: Secondary | ICD-10-CM

## 2014-11-29 DIAGNOSIS — M25521 Pain in right elbow: Secondary | ICD-10-CM

## 2014-11-29 NOTE — Progress Notes (Signed)
   Subjective:    Patient ID: Steven Morales, male    DOB: 08-01-1965, 49 y.o.   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   Pt states 1 month of pain at the flexor aspect of the right elbow, and assoc tingling.  He feels this is related to blood draw 1 month ago.   Past Medical History  Diagnosis Date  . Hypertension   . Memory loss   . Hypothyroidism   . Hyperlipidemia     Past Surgical History  Procedure Laterality Date  . Back surgery      Social History   Social History  . Marital Status: Divorced    Spouse Name: N/A  . Number of Children: 6  . Years of Education: HS   Occupational History  .      [employer]   Social History Main Topics  . Smoking status: Never Smoker   . Smokeless tobacco: Never Used  . Alcohol Use: No  . Drug Use: No  . Sexual Activity: Not on file   Other Topics Concern  . Not on file   Social History Narrative   Patient lives at home with his spouse.   Caffeine Use: 2-3 cups daily    Current Outpatient Prescriptions on File Prior to Visit  Medication Sig Dispense Refill  . amLODipine (NORVASC) 5 MG tablet Take 5 mg by mouth daily.    . Cholecalciferol (VITAMIN D-3) 1000 UNITS CAPS Take 1 capsule by mouth daily.    Marland Kitchen etodolac (LODINE) 400 MG tablet Take 400 mg by mouth 2 (two) times daily.    Marland Kitchen HYDROcodone-acetaminophen (NORCO/VICODIN) 5-325 MG per tablet Take 1 tablet by mouth every 6 (six) hours as needed for moderate pain.    Marland Kitchen ibuprofen (ADVIL,MOTRIN) 200 MG tablet Take 200 mg by mouth every 6 (six) hours as needed.    Marland Kitchen levothyroxine (SYNTHROID, LEVOTHROID) 200 MCG tablet Take 200 mcg by mouth daily before breakfast.    . pravastatin (PRAVACHOL) 20 MG tablet Take 20 mg by mouth daily.     No current facility-administered  medications on file prior to visit.    No Known Allergies  Family History  Problem Relation Age of Onset  . Hypertension Father   . Thyroid disease Father     BP 126/72 mmHg  Pulse 58  Temp(Src) 97.8 F (36.6 C)  Resp 16  Wt 186 lb 3.2 oz (84.46 kg)  Review of Systems Denies fever and weight change.    Objective:   Physical Exam VITAL SIGNS:  See vs page GENERAL: no distress Right elbow: normal to my exam      Assessment & Plan:  Elbow pain, new. Hypothyroidism, due for recheck.  Same synthroid pending result.  Patient is advised the following: Patient Instructions  blood tests are requested for you today.  We'll let you know about the results.  Please see a specialist for your pain.  you will receive a phone call, about a day and time for an appointment.  Please come back for a follow-up appointment in 6 months.

## 2014-11-29 NOTE — Patient Instructions (Addendum)
blood tests are requested for you today.  We'll let you know about the results.  Please see a specialist for your pain.  you will receive a phone call, about a day and time for an appointment.  Please come back for a follow-up appointment in 6 months.

## 2014-12-17 ENCOUNTER — Encounter: Payer: No Typology Code available for payment source | Admitting: Internal Medicine

## 2015-01-20 ENCOUNTER — Encounter: Payer: Self-pay | Admitting: Endocrinology

## 2015-04-13 ENCOUNTER — Ambulatory Visit (INDEPENDENT_AMBULATORY_CARE_PROVIDER_SITE_OTHER): Payer: Managed Care, Other (non HMO)

## 2015-04-13 ENCOUNTER — Ambulatory Visit (INDEPENDENT_AMBULATORY_CARE_PROVIDER_SITE_OTHER): Payer: Managed Care, Other (non HMO) | Admitting: Family Medicine

## 2015-04-13 VITALS — BP 132/80 | HR 66 | Temp 97.9°F | Resp 16 | Ht 70.0 in | Wt 188.2 lb

## 2015-04-13 DIAGNOSIS — M940 Chondrocostal junction syndrome [Tietze]: Secondary | ICD-10-CM

## 2015-04-13 DIAGNOSIS — R0789 Other chest pain: Secondary | ICD-10-CM

## 2015-04-13 DIAGNOSIS — E038 Other specified hypothyroidism: Secondary | ICD-10-CM | POA: Diagnosis not present

## 2015-04-13 DIAGNOSIS — R001 Bradycardia, unspecified: Secondary | ICD-10-CM | POA: Diagnosis not present

## 2015-04-13 DIAGNOSIS — E034 Atrophy of thyroid (acquired): Secondary | ICD-10-CM | POA: Diagnosis not present

## 2015-04-13 LAB — POCT CBC
Granulocyte percent: 60 %G (ref 37–80)
HEMATOCRIT: 35.5 % — AB (ref 43.5–53.7)
HEMOGLOBIN: 12.4 g/dL — AB (ref 14.1–18.1)
LYMPH, POC: 1.6 (ref 0.6–3.4)
MCH, POC: 31.8 pg — AB (ref 27–31.2)
MCHC: 34.9 g/dL (ref 31.8–35.4)
MCV: 91.3 fL (ref 80–97)
MID (cbc): 0.2 (ref 0–0.9)
MPV: 8.9 fL (ref 0–99.8)
POC GRANULOCYTE: 2.8 (ref 2–6.9)
POC LYMPH PERCENT: 35.5 %L (ref 10–50)
POC MID %: 4.5 %M (ref 0–12)
Platelet Count, POC: 255 10*3/uL (ref 142–424)
RBC: 3.89 M/uL — AB (ref 4.69–6.13)
RDW, POC: 13 %
WBC: 4.6 10*3/uL (ref 4.6–10.2)

## 2015-04-13 LAB — COMPREHENSIVE METABOLIC PANEL
ALK PHOS: 38 U/L — AB (ref 40–115)
ALT: 18 U/L (ref 9–46)
AST: 34 U/L (ref 10–35)
Albumin: 4.5 g/dL (ref 3.6–5.1)
BUN: 11 mg/dL (ref 7–25)
CHLORIDE: 103 mmol/L (ref 98–110)
CO2: 28 mmol/L (ref 20–31)
Calcium: 9.2 mg/dL (ref 8.6–10.3)
Creat: 0.99 mg/dL (ref 0.70–1.33)
GLUCOSE: 86 mg/dL (ref 65–99)
POTASSIUM: 3.5 mmol/L (ref 3.5–5.3)
Sodium: 140 mmol/L (ref 135–146)
Total Bilirubin: 1.3 mg/dL — ABNORMAL HIGH (ref 0.2–1.2)
Total Protein: 6.8 g/dL (ref 6.1–8.1)

## 2015-04-13 LAB — T4, FREE: Free T4: 0.6 ng/dL — ABNORMAL LOW (ref 0.8–1.8)

## 2015-04-13 LAB — TSH: TSH: 56.14 m[IU]/L — AB (ref 0.40–4.50)

## 2015-04-13 MED ORDER — METHOCARBAMOL 500 MG PO TABS: 500.0000 mg | ORAL_TABLET | Freq: Four times a day (QID) | ORAL | Status: DC | PRN

## 2015-04-13 MED ORDER — MELOXICAM 15 MG PO TABS: 15.0000 mg | ORAL_TABLET | Freq: Every day | ORAL | Status: DC

## 2015-04-13 NOTE — Progress Notes (Signed)
Subjective:    Patient ID: Steven Morales, male    DOB: 06-Jun-1965, 50 y.o.   MRN: [id number]  04/13/2015  Chest Pain   HPI This 50 y.o. male presents for evaluation of chest pain.  Pain with extending back.  Movement side to side causes pain.  Just came from work.  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  Onset this morning.  When takes deep breath, has chest pain.  No SOB.  No diaphoresis.  No nausea.  Did feel dizzy this morning once and then resolved; no recurrence remainder of day; one sided headache.  R side of chest pain.  Works in [employer].  Also works at [employer].  No heavy lifting.  No medication; yesterday took Tylenol last night for R sided chest pain.  Felt something last night but very mild; now pain much worse today.     Review of Systems  Constitutional: Negative for fever, chills, diaphoresis, activity change, appetite change and fatigue.  Respiratory: Negative for cough and shortness of breath.   Cardiovascular: Negative for chest pain, palpitations and leg swelling.  Gastrointestinal: Negative for nausea, vomiting, abdominal pain and diarrhea.  Endocrine: Negative for cold intolerance, heat intolerance, polydipsia, polyphagia and polyuria.  Musculoskeletal: Positive for myalgias.  Skin: Negative for color change, rash and wound.  Neurological: Negative for dizziness, tremors, seizures, syncope, facial asymmetry, speech difficulty, weakness, light-headedness, numbness and headaches.  Psychiatric/Behavioral: Negative for sleep disturbance and dysphoric mood. The patient is not nervous/anxious.     Past Medical History  Diagnosis Date  . Hypertension   . Memory loss   . Hypothyroidism   . Hyperlipidemia   . Allergy    Past Surgical History  Procedure Laterality Date  . Back surgery     No Known Allergies  Social History   Social History  . Marital Status: Divorced    Spouse Name: N/A  . Number of Children: 6  . Years of  Education: HS   Occupational History  .      [employer]   Social History Main Topics  . Smoking status: Never Smoker   . Smokeless tobacco: Never Used  . Alcohol Use: No  . Drug Use: No  . Sexual Activity: Not on file   Other Topics Concern  . Not on file   Social History Narrative   Patient lives at home with his spouse.   Caffeine Use: 2-3 cups daily   Family History  Problem Relation Age of Onset  . Hypertension Father   . Thyroid disease Father   . Diabetes Father        Objective:    BP 132/80 mmHg  Pulse 66  Temp(Src) 97.9 F (36.6 C) (Oral)  Resp 16  Ht 5\' 10"  (1.778 m)  Wt 188 lb 3.2 oz (85.367 kg)  BMI 27.00 kg/m2  SpO2 98% Physical Exam  Constitutional: He is oriented to person, place, and time. He appears well-developed and well-nourished. No distress.  HENT:  Head: Normocephalic and atraumatic.  Right Ear: External ear normal.  Left Ear: External ear normal.  Nose: Nose normal.  Mouth/Throat: Oropharynx is clear and moist.  Eyes: Conjunctivae and EOM are normal. Pupils are equal, round, and reactive to light.  Neck: Normal range of motion. Neck supple. Carotid bruit is not present. No thyromegaly present.  Cardiovascular: Normal rate, regular rhythm, normal heart sounds and intact distal pulses.  Exam reveals no gallop and no friction rub.  No murmur heard. Pulmonary/Chest: Effort normal and breath sounds normal. He has no wheezes. He has no rales. He exhibits tenderness.    +TTP R costochondral junction; +pain in R anterior chest with range of motion of neck, R shoulder.  Abdominal: Soft. Bowel sounds are normal. He exhibits no distension and no mass. There is no tenderness. There is no rebound and no guarding.  Musculoskeletal:       Right shoulder: He exhibits pain. He exhibits normal range of motion, no tenderness and no bony tenderness.       Cervical back: He exhibits pain. He exhibits normal range of motion, no tenderness, no bony  tenderness, no spasm and normal pulse.  Lymphadenopathy:    He has no cervical adenopathy.  Neurological: He is alert and oriented to person, place, and time. No cranial nerve deficit.  Skin: Skin is warm and dry. No rash noted. He is not diaphoretic.  Psychiatric: He has a normal mood and affect. His behavior is normal.  Nursing note and vitals reviewed.       Assessment & Plan:   1. Other chest pain   2. Costochondritis   3. Bradycardia   4. Hypothyroidism due to acquired atrophy of thyroid     Orders Placed This Encounter  Procedures  . DG Chest 2 View    Standing Status: Future     Number of Occurrences: 1     Standing Expiration Date: 04/12/2016    Order Specific Question:  Reason for Exam (SYMPTOM  OR DIAGNOSIS REQUIRED)    Answer:  R sided chest pain at costochondral junction    Order Specific Question:  Preferred imaging location?    Answer:  External  . Comprehensive metabolic panel  . T4, free  . TSH  . POCT CBC  . EKG 12-Lead   Meds ordered this encounter  Medications  . meloxicam (MOBIC) 15 MG tablet    Sig: Take 1 tablet (15 mg total) by mouth daily.    Dispense:  30 tablet    Refill:  0  . methocarbamol (ROBAXIN) 500 MG tablet    Sig: Take 1 tablet (500 mg total) by mouth every 6 (six) hours as needed for muscle spasms.    Dispense:  40 tablet    Refill:  0  . atorvastatin (LIPITOR) 40 MG tablet    Sig:   . levothyroxine (SYNTHROID, LEVOTHROID) 150 MCG tablet    Sig:   . metoprolol tartrate (LOPRESSOR) 25 MG tablet    Sig:   . pravastatin (PRAVACHOL) 40 MG tablet    Sig:     No Follow-up on file.    Philbert Ocallaghan Elayne Guerin, M.D. [facility] [address] [place], [place]  [address] [phone] phone [phone] fax

## 2015-04-13 NOTE — Patient Instructions (Addendum)
IF you received an x-ray today, you will receive an invoice from [facility]. Please contact [facility] at [phone] with questions or concerns regarding your invoice.   IF you received labwork today, you will receive an invoice from [facility]. Please contact [facility] at [phone] with questions or concerns regarding your invoice.   Our billing staff will not be able to assist you with questions regarding bills from these companies.  You will be contacted with the lab results as soon as they are available. The fastest way to get your results is to activate your My Chart account. Instructions are located on the last page of this paperwork. If you have not heard from [place] regarding the results in 2 weeks, please contact this office.  1.  Do not take metoprolol; it will lower your heart rate further.   Costochondritis Costochondritis, sometimes called Tietze syndrome, is a swelling and irritation (inflammation) of the tissue (cartilage) that connects your ribs with your breastbone (sternum). It causes pain in the chest and rib area. Costochondritis usually goes away on its own over time. It can take up to 6 weeks or longer to get better, especially if you are unable to limit your activities. CAUSES  Some cases of costochondritis have no known cause. Possible causes include:  Injury (trauma).  Exercise or activity such as lifting.  Severe coughing. SIGNS AND SYMPTOMS  Pain and tenderness in the chest and rib area.  Pain that gets worse when coughing or taking deep breaths.  Pain that gets worse with specific movements. DIAGNOSIS  Your health care provider will do a physical exam and ask about your symptoms. Chest X-rays or other tests may be done to rule out other problems. TREATMENT  Costochondritis usually goes away on its own over time. Your health care provider may prescribe medicine to help relieve pain. HOME CARE INSTRUCTIONS    Avoid exhausting physical activity. Try not to strain your ribs during normal activity. This would include any activities using chest, abdominal, and side muscles, especially if heavy weights are used.  Apply ice to the affected area for the first 2 days after the pain begins.  Put ice in a plastic bag.  Place a towel between your skin and the bag.  Leave the ice on for 20 minutes, 2-3 times a day.  Only take over-the-counter or prescription medicines as directed by your health care provider. SEEK MEDICAL CARE IF:  You have redness or swelling at the rib joints. These are signs of infection.  Your pain does not go away despite rest or medicine. SEEK IMMEDIATE MEDICAL CARE IF:   Your pain increases or you are very uncomfortable.  You have shortness of breath or difficulty breathing.  You cough up blood.  You have worse chest pains, sweating, or vomiting.  You have a fever or persistent symptoms for more than 2-3 days.  You have a fever and your symptoms suddenly get worse. MAKE SURE YOU:   Understand these instructions.  Will watch your condition.  Will get help right away if you are not doing well or get worse.   This information is not intended to replace advice given to you by your health care provider. Make sure you discuss any questions you have with your health care provider.   Document Released: 10/25/2004 Document Revised: 11/05/2012 Document Reviewed: 08/19/2012 Elsevier Interactive Patient Education [employer].

## 2015-05-27 ENCOUNTER — Ambulatory Visit: Payer: No Typology Code available for payment source | Admitting: Endocrinology

## 2015-05-27 DIAGNOSIS — Z0289 Encounter for other administrative examinations: Secondary | ICD-10-CM

## 2015-08-09 ENCOUNTER — Ambulatory Visit (INDEPENDENT_AMBULATORY_CARE_PROVIDER_SITE_OTHER): Payer: Managed Care, Other (non HMO) | Admitting: Emergency Medicine

## 2015-08-09 VITALS — BP 124/80 | HR 48 | Temp 98.2°F | Resp 17 | Ht 70.0 in | Wt 181.0 lb

## 2015-08-09 DIAGNOSIS — D649 Anemia, unspecified: Secondary | ICD-10-CM

## 2015-08-09 DIAGNOSIS — R300 Dysuria: Secondary | ICD-10-CM

## 2015-08-09 DIAGNOSIS — E038 Other specified hypothyroidism: Secondary | ICD-10-CM | POA: Diagnosis not present

## 2015-08-09 DIAGNOSIS — Z125 Encounter for screening for malignant neoplasm of prostate: Secondary | ICD-10-CM | POA: Diagnosis not present

## 2015-08-09 DIAGNOSIS — E785 Hyperlipidemia, unspecified: Secondary | ICD-10-CM | POA: Diagnosis not present

## 2015-08-09 DIAGNOSIS — Z131 Encounter for screening for diabetes mellitus: Secondary | ICD-10-CM

## 2015-08-09 LAB — LIPID PANEL
CHOL/HDL RATIO: 2.1 ratio (ref <=5.0)
CHOLESTEROL: 136 mg/dL (ref 125–200)
HDL: 64 mg/dL (ref >=40)
LDL Cholesterol: 64 mg/dL (ref <130)
Triglycerides: 38 mg/dL (ref <150)
VLDL: 8 mg/dL (ref <30)

## 2015-08-09 LAB — POC MICROSCOPIC URINALYSIS (UMFC)

## 2015-08-09 LAB — POCT CBC
GRANULOCYTE PERCENT: 63.1 % (ref 37–80)
HEMATOCRIT: 36.2 % — AB (ref 43.5–53.7)
HEMOGLOBIN: 12.6 g/dL — AB (ref 14.1–18.1)
Lymph, poc: 1.5 (ref 0.6–3.4)
MCH: 31.4 pg — AB (ref 27–31.2)
MCHC: 34.8 g/dL (ref 31.8–35.4)
MCV: 90 fL (ref 80–97)
MID (cbc): 0.3 (ref 0–0.9)
MPV: 8.1 fL (ref 0–99.8)
POC GRANULOCYTE: 3.1 (ref 2–6.9)
POC LYMPH %: 30.3 % (ref 10–50)
POC MID %: 6.6 %M (ref 0–12)
Platelet Count, POC: 230 10*3/uL (ref 142–424)
RBC: 4.02 M/uL — AB (ref 4.69–6.13)
RDW, POC: 12.1 %
WBC: 4.9 10*3/uL (ref 4.6–10.2)

## 2015-08-09 LAB — POCT URINALYSIS DIP (MANUAL ENTRY)
BILIRUBIN UA: NEGATIVE
BILIRUBIN UA: NEGATIVE
GLUCOSE UA: NEGATIVE
Leukocytes, UA: NEGATIVE
Nitrite, UA: NEGATIVE
RBC UA: NEGATIVE
SPEC GRAV UA: 1.025
UROBILINOGEN UA: 1
pH, UA: 6.5

## 2015-08-09 LAB — IRON AND TIBC
%SAT: 31 % (ref 15–60)
IRON: 108 ug/dL (ref 50–180)
TIBC: 347 ug/dL (ref 250–425)
UIBC: 239 ug/dL (ref 125–400)

## 2015-08-09 LAB — TSH: TSH: 22.77 mIU/L — ABNORMAL HIGH (ref 0.40–4.50)

## 2015-08-09 LAB — HIV ANTIBODY (ROUTINE TESTING W REFLEX): HIV: NONREACTIVE

## 2015-08-09 LAB — GLUCOSE, POCT (MANUAL RESULT ENTRY): POC GLUCOSE: 94 mg/dL (ref 70–99)

## 2015-08-09 MED ORDER — MELOXICAM 7.5 MG PO TABS: 7.5000 mg | ORAL_TABLET | Freq: Every day | ORAL | Status: DC

## 2015-08-09 MED ORDER — LEVOTHYROXINE SODIUM 150 MCG PO TABS: 150.0000 ug | ORAL_TABLET | Freq: Every day | ORAL | Status: DC

## 2015-08-09 MED ORDER — MELOXICAM 15 MG PO TABS: 15.0000 mg | ORAL_TABLET | Freq: Every day | ORAL | Status: DC

## 2015-08-09 MED ORDER — ATORVASTATIN CALCIUM 40 MG PO TABS: 40.0000 mg | ORAL_TABLET | Freq: Every day | ORAL | Status: DC

## 2015-08-09 MED ORDER — METHOCARBAMOL 500 MG PO TABS: 500.0000 mg | ORAL_TABLET | Freq: Four times a day (QID) | ORAL | Status: DC | PRN

## 2015-08-09 NOTE — Patient Instructions (Addendum)
I will call you with results of your cultures .    IF you received an x-ray today, you will receive an invoice from [facility]. Please contact [facility] at [phone] with questions or concerns regarding your invoice.   IF you received labwork today, you will receive an invoice from [facility] [employer]. Please contact [facility] at [phone] with questions or concerns regarding your invoice.   Our billing staff will not be able to assist you with questions regarding bills from these companies.  You will be contacted with the lab results as soon as they are available. The fastest way to get your results is to activate your My Chart account. Instructions are located on the last page of this paperwork. If you have not heard from [place] regarding the results in 2 weeks, please contact this office.

## 2015-08-09 NOTE — Addendum Note (Signed)
Addended by: Arlyss Queen A on: 08/09/2015 09:49 AM   Modules accepted: Orders, Medications, SmartSet

## 2015-08-09 NOTE — Progress Notes (Addendum)
By signing my name below, I, Mesha Guinyard, attest that this documentation has been prepared under the direction and in the presence of Arlyss Queen, MD.  Electronically Signed: Verlee Monte, Medical Scribe. 08/09/2015. 8:43 AM.  Chief Complaint:  Chief Complaint  Patient presents with   Back Pain   Sore Throat    HPI: Steven Morales is a 50 y.o. male who reports to [facility] today complaining of sore throat onset last night. Pt reports associated symptoms of subjective fever, and productive cough with cream colored sputum. Pt denies smoking, or alcohol abuse. Pt would like to get a refill on his medication.  GU: Pt reports itchiness at the tip of his penis. Pt is sexually active with his wife. Pt denies having other partners.   Back Pain: Pt reports back pain onset 2-3 days ago. Pt states when he moves his leg he feels pain in his back. Pain also worsens with twisting his body with movement.  Past Medical History  Diagnosis Date   Hypertension    Memory loss    Hypothyroidism    Hyperlipidemia    Allergy    Past Surgical History  Procedure Laterality Date   Back surgery     Social History   Social History   Marital Status: Divorced    Spouse Name: N/A   Number of Children: 6   Years of Education: HS   Occupational History        [employer]   Social History Main Topics   Smoking status: Never Smoker    Smokeless tobacco: Never Used   Alcohol Use: No   Drug Use: No   Sexual Activity: Not Asked   Other Topics Concern   None   Social History Narrative   Patient lives at home with his spouse.   Caffeine Use: 2-3 cups daily   Family History  Problem Relation Age of Onset   Hypertension Father    Thyroid disease Father    Diabetes Father    No Known Allergies Prior to Admission medications   Medication Sig Start Date End Date Taking? Authorizing Provider  atorvastatin (LIPITOR) 40 MG tablet  02/09/15  Yes Historical Provider, MD    levothyroxine (SYNTHROID, LEVOTHROID) 150 MCG tablet  04/10/15  Yes Historical Provider, MD  amLODipine (NORVASC) 5 MG tablet Take 5 mg by mouth daily. Reported on 08/09/2015    Historical Provider, MD  Cholecalciferol (VITAMIN D-3) 1000 UNITS CAPS Take 1 capsule by mouth daily. Reported on 08/09/2015    Historical Provider, MD  ibuprofen (ADVIL,MOTRIN) 200 MG tablet Take 200 mg by mouth every 6 (six) hours as needed. Reported on 08/09/2015    Historical Provider, MD  meloxicam (MOBIC) 15 MG tablet Take 1 tablet (15 mg total) by mouth daily. Patient not taking: Reported on 08/09/2015 04/13/15   Wardell Honour, MD  methocarbamol (ROBAXIN) 500 MG tablet Take 1 tablet (500 mg total) by mouth every 6 (six) hours as needed for muscle spasms. Patient not taking: Reported on 08/09/2015 04/13/15   Wardell Honour, MD  metoprolol tartrate (LOPRESSOR) 25 MG tablet Reported on 08/09/2015 04/10/15   Historical Provider, MD  pravastatin (PRAVACHOL) 40 MG tablet Reported on 08/09/2015 04/07/15   Historical Provider, MD     ROS: The patient denies chills, night sweats, unintentional weight loss, chest pain, palpitations, wheezing, dyspnea on exertion, nausea, vomiting, abdominal pain, dysuria, hematuria, melena, numbness, weakness, or tingling. +Fevers. +Productive cough. + Back pain. +Sore throat. +Penile itchiness.  All other  systems have been reviewed and were otherwise negative with the exception of those mentioned in the HPI and as above.    PHYSICAL EXAM: Filed Vitals:   08/09/15 0830  BP: 124/80  Pulse: 48  Temp: 98.2 F (36.8 C)  Resp: 17   Body mass index is 25.97 kg/(m^2).   General: Alert, no acute distress HEENT:  Normocephalic, atraumatic, oropharynx patent. Eye: [facility] Cardiovascular:  Regular rate and rhythm, no rubs murmurs or gallops.  No Carotid bruits, radial pulse intact. No pedal edema.  Respiratory: Clear to auscultation bilaterally.  No wheezes, rales, or rhonchi.  No cyanosis, no  use of accessory musculature Abdominal: No organomegaly, abdomen is soft and non-tender, positive bowel sounds.  No masses. Musculoskeletal: Gait intact. No edema, tenderness Skin: No rashes. Scar on the right side of his head Neurologic: Facial musculature symmetric. Psychiatric: Patient acts appropriately throughout our interaction. Lymphatic: No cervical or submandibular lymphadenopathy GU: No swelling of testicles. Appears to be a minimal clear discharge at the urethral meatus  LABS: Results for orders placed or performed in visit on 08/09/15  POCT CBC  Result Value Ref Range   WBC 4.9 4.6 - 10.2 K/uL   Lymph, poc 1.5 0.6 - 3.4   POC LYMPH PERCENT 30.3 10 - 50 %L   MID (cbc) 0.3 0 - 0.9   POC MID % 6.6 0 - 12 %M   POC Granulocyte 3.1 2 - 6.9   Granulocyte percent 63.1 37 - 80 %G   RBC 4.02 (A) 4.69 - 6.13 M/uL   Hemoglobin 12.6 (A) 14.1 - 18.1 g/dL   HCT, POC 36.2 (A) 43.5 - 53.7 %   MCV 90.0 80 - 97 fL   MCH, POC 31.4 (A) 27 - 31.2 pg   MCHC 34.8 31.8 - 35.4 g/dL   RDW, POC 12.1 %   Platelet Count, POC 230 142 - 424 K/uL   MPV 8.1 0 - 99.8 fL  POCT glucose (manual entry)  Result Value Ref Range   POC Glucose 94 70 - 99 mg/dl  POCT urinalysis dipstick  Result Value Ref Range   Color, UA yellow yellow   Clarity, UA clear clear   Glucose, UA negative negative   Bilirubin, UA negative negative   Ketones, POC UA negative negative   Spec Grav, UA 1.025    Blood, UA negative negative   pH, UA 6.5    Protein Ur, POC trace (A) negative   Urobilinogen, UA 1.0    Nitrite, UA Negative Negative   Leukocytes, UA Negative Negative   EKG/XRAY:   Primary read interpreted by Dr. [facility]lene Farrier at [facility].   ASSESSMENT/PLAN:His urine did not show any signs of infection. We'll wait on culture results area he was complaining of back pain and is motivated and Robaxin were refilled. He also requesting refills of his Lipitor and thyroid medication. The physician's name on the record is Dr. Suzette Battiest  Epic chart says Dr. Loanne Drilling.I personally performed the services described in this documentation, which was scribed in my presence. The recorded information has been reviewed and is accurate. I told him I will call him with his culture results and then he can decide if his wife needs to have an examination.  Gross sideeffects, risk and benefits, and alternatives of medications d/w patient. Patient is aware that all medications have potential sideeffects and we are unable to predict every sideeffect or drug-drug interaction that may occur.  Arlyss Queen MD 08/09/2015 8:42 AM

## 2015-08-10 ENCOUNTER — Other Ambulatory Visit: Payer: Self-pay | Admitting: Emergency Medicine

## 2015-08-10 LAB — GC/CHLAMYDIA PROBE AMP
CT Probe RNA: NOT DETECTED
GC Probe RNA: NOT DETECTED

## 2015-08-10 LAB — PSA: PSA: 1.4 ng/mL (ref <=4.00)

## 2015-08-10 LAB — RPR

## 2015-08-10 LAB — HEPATITIS C ANTIBODY: HCV Ab: NEGATIVE

## 2015-08-10 MED ORDER — LEVOTHYROXINE SODIUM 150 MCG PO TABS: 150.0000 ug | ORAL_TABLET | Freq: Every day | ORAL | Status: DC

## 2015-08-23 ENCOUNTER — Telehealth: Payer: Self-pay | Admitting: Emergency Medicine

## 2015-08-23 NOTE — Telephone Encounter (Signed)
-----   Message from Darlyne Russian, MD sent at 08/10/2015 10:41 AM EDT ----- Call patient. It does not appear he is taking his thyroid medication. I have resent that to the pharmacy for him.

## 2015-10-31 ENCOUNTER — Ambulatory Visit (INDEPENDENT_AMBULATORY_CARE_PROVIDER_SITE_OTHER): Payer: Managed Care, Other (non HMO) | Admitting: Physician Assistant

## 2015-10-31 VITALS — BP 124/78 | HR 57 | Temp 98.5°F | Resp 17 | Ht 70.0 in | Wt 180.0 lb

## 2015-10-31 DIAGNOSIS — R52 Pain, unspecified: Secondary | ICD-10-CM | POA: Diagnosis not present

## 2015-10-31 DIAGNOSIS — Z1211 Encounter for screening for malignant neoplasm of colon: Secondary | ICD-10-CM

## 2015-10-31 DIAGNOSIS — M545 Low back pain, unspecified: Secondary | ICD-10-CM

## 2015-10-31 DIAGNOSIS — Z23 Encounter for immunization: Secondary | ICD-10-CM | POA: Diagnosis not present

## 2015-10-31 DIAGNOSIS — R001 Bradycardia, unspecified: Secondary | ICD-10-CM

## 2015-10-31 DIAGNOSIS — M5136 Other intervertebral disc degeneration, lumbar region: Secondary | ICD-10-CM | POA: Insufficient documentation

## 2015-10-31 DIAGNOSIS — M51369 Other intervertebral disc degeneration, lumbar region without mention of lumbar back pain or lower extremity pain: Secondary | ICD-10-CM | POA: Insufficient documentation

## 2015-10-31 MED ORDER — CYCLOBENZAPRINE HCL 10 MG PO TABS: 10.0000 mg | ORAL_TABLET | Freq: Three times a day (TID) | ORAL | 0 refills | Status: DC | PRN

## 2015-10-31 MED ORDER — MELOXICAM 15 MG PO TABS: 15.0000 mg | ORAL_TABLET | Freq: Every day | ORAL | 1 refills | Status: DC

## 2015-10-31 NOTE — Patient Instructions (Addendum)
Drink plenty of fluids and rest for these next few days. Take the Mobic 1 x day as need for your back pain.  When you go back to work don't lift anything heavier than 30lbs for the next 2 weeks   Back Pain, Adult Back pain is very common in adults.The cause of back pain is rarely dangerous and the pain often gets better over time.The cause of your back pain may not be known. Some common causes of back pain include:  Strain of the muscles or ligaments supporting the spine.  Wear and tear (degeneration) of the spinal disks.  Arthritis.  Direct injury to the back. For many people, back pain may return. Since back pain is rarely dangerous, most people can learn to manage this condition on their own. HOME CARE INSTRUCTIONS Watch your back pain for any changes. The following actions may help to lessen any discomfort you are feeling:  Remain active. It is stressful on your back to sit or stand in one place for long periods of time. Do not sit, drive, or stand in one place for more than 30 minutes at a time. Take short walks on even surfaces as soon as you are able.Try to increase the length of time you walk each day.  Exercise regularly as directed by your health care provider. Exercise helps your back heal faster. It also helps avoid future injury by keeping your muscles strong and flexible.  Do not stay in bed.Resting more than 1-2 days can delay your recovery.  Pay attention to your body when you bend and lift. The most comfortable positions are those that put less stress on your recovering back. Always use proper lifting techniques, including:  Bending your knees.  Keeping the load close to your body.  Avoiding twisting.  Find a comfortable position to sleep. Use a firm mattress and lie on your side with your knees slightly bent. If you lie on your back, put a pillow under your knees.  Avoid feeling anxious or stressed.Stress increases muscle tension and can worsen back pain.It  is important to recognize when you are anxious or stressed and learn ways to manage it, such as with exercise.  Take medicines only as directed by your health care provider. Over-the-counter medicines to reduce pain and inflammation are often the most helpful.Your health care provider may prescribe muscle relaxant drugs.These medicines help dull your pain so you can more quickly return to your normal activities and healthy exercise.  Apply ice to the injured area:  Put ice in a plastic bag.  Place a towel between your skin and the bag.  Leave the ice on for 20 minutes, 2-3 times a day for the first 2-3 days. After that, ice and heat may be alternated to reduce pain and spasms.  Maintain a healthy weight. Excess weight puts extra stress on your back and makes it difficult to maintain good posture. SEEK MEDICAL CARE IF:  You have pain that is not relieved with rest or medicine.  You have increasing pain going down into the legs or buttocks.  You have pain that does not improve in one week.  You have night pain.  You lose weight.  You have a fever or chills. SEEK IMMEDIATE MEDICAL CARE IF:   You develop new bowel or bladder control problems.  You have unusual weakness or numbness in your arms or legs.  You develop nausea or vomiting.  You develop abdominal pain.  You feel faint.   This information is  not intended to replace advice given to you by your health care provider. Make sure you discuss any questions you have with your health care provider.   Document Released: 01/15/2005 Document Revised: 02/05/2014 Document Reviewed: 05/19/2013 Elsevier Interactive Patient Education 2016 [employer].   IF you received an x-ray today, you will receive an invoice from [facility]. Please contact [facility] at [phone] with questions or concerns regarding your invoice.   IF you received labwork today, you will receive an invoice from [employer]. Please contact [facility] at [phone] with questions or concerns regarding your invoice.   Our billing staff will not be able to assist you with questions regarding bills from these companies.  You will be contacted with the lab results as soon as they are available. The fastest way to get your results is to activate your My Chart account. Instructions are located on the last page of this paperwork. If you have not heard from [place] regarding the results in 2 weeks, please contact this office.

## 2015-10-31 NOTE — Progress Notes (Signed)
Patient ID: Steven Morales, male    DOB: 1965-08-09, 50 y.o.   MRN: [id number]  PCP: Benito Mccreedy, MD, reports that he doesn't know who this is, and that he's been seeing Dr. Everlene Farrier at this clinic. Dr. Everlene Farrier retired last week.  Subjective:   Chief Complaint  Patient presents with  . Generalized Body Aches  . Back Pain    x2-3days . NKI.     HPI Presents for evaluation of several days of recurrent low back pain.  He has known DDD of the lumbar spine, s/p surgery several years ago. He has intermittently used meloxicam and muscle relaxers, but at his visit here in 07/2015, the meloxicam dose was reduced to 7.5 mg and he was prescribed Robaxin, which he states were not very effective. He's been on something else in the past that worked better.  He denies any recent injury or stress to the low back.  He denies fever, chills, nausea, vomiting, abdominal pain, urinary urgency, frequency, burning, loss of bowel/bladder control, radicular pain, paresthesias and legs giving way. He also denies cough, sore throat, ear pressure/fullness/pain.  He reports a headache and generalized malaise beginning last night, for which he has taken two doses of acetaminophen (last night and this morning) with good results, though he had no change in his back pain. He notes that his wife has a "bad cold."    Review of Systems As above.    Patient Active Problem List   Diagnosis Date Noted  . Degenerative disc disease, lumbar 10/31/2015  . Pain in right elbow 11/29/2014  . Hypothyroidism following radioiodine therapy 06/24/2014     Prior to Admission medications   Medication Sig Start Date End Date Taking? Authorizing Provider  amLODipine (NORVASC) 5 MG tablet Take 5 mg by mouth daily. Reported on 08/09/2015   Yes Historical Provider, MD  atorvastatin (LIPITOR) 40 MG tablet Take 1 tablet (40 mg total) by mouth daily at 6 PM. 08/09/15  Yes Darlyne Russian, MD  Cholecalciferol (VITAMIN D-3) 1000 UNITS CAPS  Take 1 capsule by mouth daily. Reported on 08/09/2015   Yes Historical Provider, MD  ibuprofen (ADVIL,MOTRIN) 200 MG tablet Take 200 mg by mouth every 6 (six) hours as needed. Reported on 08/09/2015   Yes Historical Provider, MD  levothyroxine (SYNTHROID, LEVOTHROID) 150 MCG tablet Take 1 tablet (150 mcg total) by mouth daily before breakfast. 08/10/15  Yes Darlyne Russian, MD  meloxicam (MOBIC) 7.5 MG tablet Take 1 tablet (7.5 mg total) by mouth daily. 08/09/15  Yes Darlyne Russian, MD  cetirizine (ZYRTEC) 10 MG tablet  10/03/15   Historical Provider, MD  furosemide (LASIX) 40 MG tablet  10/03/15   Historical Provider, MD  hydrochlorothiazide (HYDRODIURIL) 25 MG tablet  10/03/15   Historical Provider, MD  methocarbamol (ROBAXIN) 500 MG tablet Take 1 tablet (500 mg total) by mouth every 6 (six) hours as needed for muscle spasms. Patient not taking: Reported on 10/31/2015 08/09/15   Darlyne Russian, MD  metoprolol tartrate (LOPRESSOR) 25 MG tablet Reported on 08/09/2015 04/10/15   Historical Provider, MD  Olopatadine HCl 0.2 % SOLN  10/29/15   Historical Provider, MD  simvastatin (ZOCOR) 40 MG tablet  10/29/15   Historical Provider, MD     No Known Allergies     Objective:  Physical Exam  Constitutional: He is oriented to person, place, and time. He appears well-developed and well-nourished. He is active and cooperative. No distress.  BP 124/78 (BP Location: Right Arm, Patient Position:  Sitting, Cuff Size: Normal)   Pulse (!) 57   Temp 98.5 F (36.9 C) (Oral)   Resp 17   Ht 5\' 10"  (1.778 m)   Wt 180 lb (81.6 kg)   SpO2 99%   BMI 25.83 kg/m   HENT:  Head: Normocephalic and atraumatic.  Right Ear: Hearing normal.  Left Ear: Hearing normal.  Eyes: Conjunctivae are normal. No scleral icterus.  Neck: Normal range of motion. Neck supple. No thyromegaly present.  Cardiovascular: Normal rate, regular rhythm and normal heart sounds.   Pulses:      Radial pulses are 2+ on the right side, and 2+ on the left  side.  Pulmonary/Chest: Effort normal and breath sounds normal.  Abdominal: Normal appearance and bowel sounds are normal. There is no hepatosplenomegaly. There is tenderness in the epigastric area. There is no CVA tenderness.  Lymphadenopathy:       Head (right side): No tonsillar, no preauricular, no posterior auricular and no occipital adenopathy present.       Head (left side): No tonsillar, no preauricular, no posterior auricular and no occipital adenopathy present.    He has no cervical adenopathy.       Right: No supraclavicular adenopathy present.       Left: No supraclavicular adenopathy present.  Neurological: He is alert and oriented to person, place, and time. He has normal strength. No cranial nerve deficit or sensory deficit.  Reflex Scores:      Patellar reflexes are 1+ on the right side and 1+ on the left side.      Achilles reflexes are 1+ on the right side and 1+ on the left side. Skin: Skin is warm, dry and intact. No rash noted. No cyanosis or erythema. Nails show no clubbing.  Psychiatric: He has a normal mood and affect. His speech is normal and behavior is normal.           Assessment & Plan:   1. Midline low back pain without sciatica, unspecified chronicity Acute on chronic. Restart meloxicam, with warning to take with food, given the epigastric tenderness noted on exam (though he has no GI complaints). Re-try cyclobenzaprine, which it appears he had in 03/2012. If he doesn't achieve benefit with these, will recommend PT. Work note for today. - meloxicam (MOBIC) 15 MG tablet; Take 1 tablet (15 mg total) by mouth daily.  Dispense: 30 tablet; Refill: 1 - cyclobenzaprine (FLEXERIL) 10 MG tablet; Take 1 tablet (10 mg total) by mouth 3 (three) times daily as needed for muscle spasms.  Dispense: 30 tablet; Refill: 0  2. Body aches Unclear etiology, likely early viral syndrome, and certainly may evolve over the next several days.  3. Bradycardia Asymptomatic. Chart  review reveals long-standing bradycardia. Most recent EKG was 03/2015. Unclear if he is taking lopressor. He notes that his endocrinologist manages his BP. Last TSH (7/17) was elevated above 22.  4. Need for influenza vaccination - Flu Vaccine QUAD 36+ mos IM  5. Screening for colon cancer - Ambulatory referral to Gastroenterology   Fara Chute, PA-C Physician Assistant-Certified [facility]
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
## 2015-11-03 ENCOUNTER — Encounter: Payer: Self-pay | Admitting: Gastroenterology

## 2015-11-28 ENCOUNTER — Other Ambulatory Visit: Payer: Self-pay | Admitting: Physician Assistant

## 2015-11-28 DIAGNOSIS — M545 Low back pain, unspecified: Secondary | ICD-10-CM

## 2016-01-09 ENCOUNTER — Encounter: Payer: No Typology Code available for payment source | Admitting: Gastroenterology

## 2016-03-29 DIAGNOSIS — I10 Essential (primary) hypertension: Secondary | ICD-10-CM | POA: Diagnosis not present

## 2016-03-29 DIAGNOSIS — E890 Postprocedural hypothyroidism: Secondary | ICD-10-CM | POA: Diagnosis not present

## 2016-08-17 ENCOUNTER — Emergency Department (HOSPITAL_COMMUNITY): Payer: Self-pay

## 2016-08-17 ENCOUNTER — Emergency Department (HOSPITAL_COMMUNITY)
Admission: EM | Admit: 2016-08-17 | Discharge: 2016-08-17 | Disposition: A | Discharge status: Home / Self Care | Payer: Self-pay | Attending: Emergency Medicine | Admitting: Emergency Medicine

## 2016-08-17 DIAGNOSIS — S20212A Contusion of left front wall of thorax, initial encounter: Secondary | ICD-10-CM | POA: Insufficient documentation

## 2016-08-17 DIAGNOSIS — Y939 Activity, unspecified: Secondary | ICD-10-CM | POA: Insufficient documentation

## 2016-08-17 DIAGNOSIS — Y92410 Unspecified street and highway as the place of occurrence of the external cause: Secondary | ICD-10-CM | POA: Insufficient documentation

## 2016-08-17 DIAGNOSIS — D171 Benign lipomatous neoplasm of skin and subcutaneous tissue of trunk: Secondary | ICD-10-CM | POA: Insufficient documentation

## 2016-08-17 DIAGNOSIS — S61402A Unspecified open wound of left hand, initial encounter: Secondary | ICD-10-CM | POA: Diagnosis not present

## 2016-08-17 DIAGNOSIS — T148XXA Other injury of unspecified body region, initial encounter: Secondary | ICD-10-CM | POA: Diagnosis not present

## 2016-08-17 DIAGNOSIS — Y999 Unspecified external cause status: Secondary | ICD-10-CM | POA: Insufficient documentation

## 2016-08-17 LAB — COMPREHENSIVE METABOLIC PANEL
ALT: 27 U/L (ref 17–63)
AST: 60 U/L — ABNORMAL HIGH (ref 15–41)
Albumin: 4.9 g/dL (ref 3.5–5.0)
Alkaline Phosphatase: 33 U/L — ABNORMAL LOW (ref 38–126)
Anion gap: 6 (ref 5–15)
BUN: 10 mg/dL (ref 6–20)
CHLORIDE: 103 mmol/L (ref 101–111)
CO2: 27 mmol/L (ref 22–32)
CREATININE: 0.97 mg/dL (ref 0.61–1.24)
Calcium: 9.2 mg/dL (ref 8.9–10.3)
Glucose, Bld: 123 mg/dL — ABNORMAL HIGH (ref 65–99)
POTASSIUM: 3 mmol/L — AB (ref 3.5–5.1)
Sodium: 136 mmol/L (ref 135–145)
TOTAL PROTEIN: 7.5 g/dL (ref 6.5–8.1)
Total Bilirubin: 1.9 mg/dL — ABNORMAL HIGH (ref 0.3–1.2)

## 2016-08-17 LAB — BPAM RBC
BLOOD PRODUCT EXPIRATION DATE: 201807262359
Blood Product Expiration Date: 201807272359
ISSUE DATE / TIME: 201807200840
ISSUE DATE / TIME: 201807200840
UNIT TYPE AND RH: 9500
Unit Type and Rh: 9500

## 2016-08-17 LAB — TYPE AND SCREEN
ABO/RH(D): B POS
ANTIBODY SCREEN: NEGATIVE
UNIT DIVISION: 0
Unit division: 0

## 2016-08-17 LAB — PREPARE FRESH FROZEN PLASMA
UNIT DIVISION: 0
Unit division: 0

## 2016-08-17 LAB — I-STAT CG4 LACTIC ACID, ED: LACTIC ACID, VENOUS: 0.64 mmol/L (ref 0.5–1.9)

## 2016-08-17 LAB — BPAM FFP
BLOOD PRODUCT EXPIRATION DATE: 201808052359
Blood Product Expiration Date: 201807222359
ISSUE DATE / TIME: 201807200840
ISSUE DATE / TIME: 201807200840
UNIT TYPE AND RH: 6200
UNIT TYPE AND RH: 6200

## 2016-08-17 LAB — I-STAT CHEM 8, ED
BUN: 11 mg/dL (ref 6–20)
CREATININE: 1 mg/dL (ref 0.61–1.24)
Calcium, Ion: 1.11 mmol/L — ABNORMAL LOW (ref 1.15–1.40)
Chloride: 101 mmol/L (ref 101–111)
GLUCOSE: 122 mg/dL — AB (ref 65–99)
HCT: 37 % — ABNORMAL LOW (ref 39.0–52.0)
HEMOGLOBIN: 12.6 g/dL — AB (ref 13.0–17.0)
Potassium: 3.1 mmol/L — ABNORMAL LOW (ref 3.5–5.1)
Sodium: 141 mmol/L (ref 135–145)
TCO2: 27 mmol/L (ref 0–100)

## 2016-08-17 LAB — CBC
HEMATOCRIT: 33.7 % — AB (ref 39.0–52.0)
Hemoglobin: 11.2 g/dL — ABNORMAL LOW (ref 13.0–17.0)
MCH: 30.9 pg (ref 26.0–34.0)
MCHC: 33.2 g/dL (ref 30.0–36.0)
MCV: 93.1 fL (ref 78.0–100.0)
PLATELETS: 207 10*3/uL (ref 150–400)
RBC: 3.62 MIL/uL — AB (ref 4.22–5.81)
RDW: 13 % (ref 11.5–15.5)
WBC: 5.8 10*3/uL (ref 4.0–10.5)

## 2016-08-17 LAB — ETHANOL: Alcohol, Ethyl (B): <5 mg/dL (ref <5)

## 2016-08-17 LAB — PROTIME-INR
INR: 1.08
PROTHROMBIN TIME: 14.1 s (ref 11.4–15.2)

## 2016-08-17 LAB — ABO/RH: ABO/RH(D): B POS

## 2016-08-17 LAB — CDS SEROLOGY

## 2016-08-17 MED ORDER — SODIUM CHLORIDE 0.9 % IV SOLN: INTRAVENOUS | Status: DC

## 2016-08-17 MED ORDER — IOPAMIDOL (ISOVUE-300) INJECTION 61%
INTRAVENOUS | Status: AC
  Administered 2016-08-17: 100 mL
  Filled 2016-08-17: qty 100

## 2016-08-17 MED ORDER — FENTANYL CITRATE (PF) 100 MCG/2ML IJ SOLN
INTRAMUSCULAR | Status: AC
  Filled 2016-08-17: qty 2

## 2016-08-17 MED ORDER — ACETAMINOPHEN 500 MG PO TABS: 1000.0000 mg | ORAL_TABLET | Freq: Three times a day (TID) | ORAL | 0 refills | Status: AC

## 2016-08-17 MED ORDER — SODIUM CHLORIDE 0.9 % IV BOLUS (SEPSIS): 1000.0000 mL | Freq: Once | INTRAVENOUS | Status: DC

## 2016-08-17 MED ORDER — SODIUM CHLORIDE 0.9 % IV SOLN
INTRAVENOUS | Status: AC | PRN
  Administered 2016-08-17: 1000 mL via INTRAVENOUS

## 2016-08-17 MED ORDER — FENTANYL CITRATE (PF) 100 MCG/2ML IJ SOLN
INTRAMUSCULAR | Status: AC | PRN
  Administered 2016-08-17: 75 ug via INTRAVENOUS
  Administered 2016-08-17: 50 ug via INTRAVENOUS

## 2016-08-17 MED ORDER — IBUPROFEN 600 MG PO TABS: 600.0000 mg | ORAL_TABLET | Freq: Four times a day (QID) | ORAL | 0 refills | Status: AC | PRN

## 2016-08-17 NOTE — ED Notes (Signed)
Pt has large soft, swelling area to left rib cage-- has hx of lipoma, "for many years"

## 2016-08-17 NOTE — ED Notes (Signed)
Pt verbalized understanding of d/c instructions and has no further questions. Pt stable and NAD. VSS. Pt ambulatory and declined wheelchair. D/c home with family driving.
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
## 2016-10-02 DIAGNOSIS — I10 Essential (primary) hypertension: Secondary | ICD-10-CM | POA: Diagnosis not present

## 2016-10-02 DIAGNOSIS — E890 Postprocedural hypothyroidism: Secondary | ICD-10-CM | POA: Diagnosis not present

## 2016-10-02 DIAGNOSIS — N529 Male erectile dysfunction, unspecified: Secondary | ICD-10-CM | POA: Diagnosis not present

## 2016-10-02 DIAGNOSIS — E162 Hypoglycemia, unspecified: Secondary | ICD-10-CM | POA: Diagnosis not present

## 2016-12-10 DIAGNOSIS — E890 Postprocedural hypothyroidism: Secondary | ICD-10-CM | POA: Diagnosis not present

## 2017-02-12 DIAGNOSIS — E890 Postprocedural hypothyroidism: Secondary | ICD-10-CM | POA: Diagnosis not present

## 2017-02-27 ENCOUNTER — Other Ambulatory Visit: Payer: Self-pay

## 2017-02-27 ENCOUNTER — Ambulatory Visit: Payer: BLUE CROSS/BLUE SHIELD | Admitting: Emergency Medicine

## 2017-02-27 ENCOUNTER — Encounter: Payer: Self-pay | Admitting: Emergency Medicine

## 2017-02-27 VITALS — BP 108/66 | HR 51 | Temp 97.7°F | Resp 16 | Ht 71.0 in | Wt 182.4 lb

## 2017-02-27 DIAGNOSIS — M79601 Pain in right arm: Secondary | ICD-10-CM | POA: Insufficient documentation

## 2017-02-27 DIAGNOSIS — M791 Myalgia, unspecified site: Secondary | ICD-10-CM | POA: Diagnosis not present

## 2017-02-27 DIAGNOSIS — M79602 Pain in left arm: Secondary | ICD-10-CM

## 2017-02-27 DIAGNOSIS — D171 Benign lipomatous neoplasm of skin and subcutaneous tissue of trunk: Secondary | ICD-10-CM | POA: Insufficient documentation

## 2017-02-27 MED ORDER — DICLOFENAC SODIUM 75 MG PO TBEC: 75.0000 mg | DELAYED_RELEASE_TABLET | Freq: Two times a day (BID) | ORAL | 0 refills | Status: AC

## 2017-02-27 NOTE — Patient Instructions (Addendum)
IF you received an x-ray today, you will receive an invoice from [facility]. Please contact [facility] at [phone] with questions or concerns regarding your invoice.   IF you received labwork today, you will receive an invoice from [facility]. Please contact [facility] at [phone] with questions or concerns regarding your invoice.   Our billing staff will not be able to assist you with questions regarding bills from these companies.  You will be contacted with the lab results as soon as they are available. The fastest way to get your results is to activate your My Chart account. Instructions are located on the last page of this paperwork. If you have not heard from [place] regarding the results in 2 weeks, please contact this office.     Muscle Pain, Adult Muscle pain (myalgia) may be mild or severe. In most cases, the pain lasts only a short time and it goes away without treatment. It is normal to feel some muscle pain after starting a workout program. Muscles that have not been used often will be sore at first. Muscle pain may also be caused by many other things, including:  Overuse or muscle strain, especially if you are not in shape. This is the most common cause of muscle pain.  Injury.  Bruises.  Viruses, such as the flu.  Infectious diseases.  A chronic condition that causes muscle tenderness, fatigue, and headache (fibromyalgia).  A condition, such as lupus, in which the body's disease-fighting system attacks other organs in the body (autoimmune or rheumatologic diseases).  Certain drugs, including ACE inhibitors and statins.  To diagnose the cause of your muscle pain, your health care provider will do a physical exam and ask questions about the pain and when it began. If you have not had muscle pain for very long, your health care provider may want to wait before doing much testing. If your muscle pain has lasted a long time, your health care provider  may want to run tests right away. In some cases, this may include tests to rule out certain conditions or illnesses. Treatment for muscle pain depends on the cause. Home care is often enough to relieve muscle pain. Your health care provider may also prescribe anti-inflammatory medicine. Follow these instructions at home: Activity  If overuse is causing your muscle pain: ? Slow down your activities until the pain goes away. ? Do regular, gentle exercises if you are not usually active. ? Warm up before exercising. Stretch before and after exercising. This can help lower the risk of muscle pain.  Do not continue working out if the pain is very bad. Bad pain could mean that you have injured a muscle. Managing pain and discomfort   If directed, apply ice to the sore muscle: ? Put ice in a plastic bag. ? Place a towel between your skin and the bag. ? Leave the ice on for 20 minutes, 2-3 times a day.  You may also alternate between applying ice and applying heat as told by your health care provider. To apply heat, use the heat source that your health care provider recommends, such as a moist heat pack or a heating pad. ? Place a towel between your skin and the heat source. ? Leave the heat on for 20-30 minutes. ? Remove the heat if your skin turns bright red. This is especially important if you are unable to feel pain, heat, or cold. You may have a greater risk of getting burned. Medicines  Take  over-the-counter and prescription medicines only as told by your health care provider.  Do not drive or use heavy machinery while taking prescription pain medicine. Contact a health care provider if:  Your muscle pain gets worse and medicines do not help.  You have muscle pain that lasts longer than 3 days.  You have a rash or fever along with muscle pain.  You have muscle pain after a tick bite.  You have muscle pain while working out, even though you are in good physical condition.  You  have redness, soreness, or swelling along with muscle pain.  You have muscle pain after starting a new medicine or changing the dose of a medicine. Get help right away if:  You have trouble breathing.  You have trouble swallowing.  You have muscle pain along with a stiff neck, fever, and vomiting.  You have severe muscle weakness or cannot move part of your body. This information is not intended to replace advice given to you by your health care provider. Make sure you discuss any questions you have with your health care provider. Document Released: 12/07/2005 Document Revised: 08/05/2015 Document Reviewed: 06/07/2015 Elsevier Interactive Patient Education  2018 [employer].

## 2017-02-27 NOTE — Progress Notes (Signed)
Steven Morales 52 y.o.   Chief Complaint  Patient presents with  . Shoulder Pain    BOTH - arms x 6 months  . Mass    on side of upper left chest area x 3-4 years    HISTORY OF PRESENT ILLNESS: This is a 52 y.o. male complaining of pain to both legs for the past 6 months.  Physical work.  No other significant symptoms.  Also complaining of a large nontender mass to the left side of his torso.  Denies pain to the legs.  Denies fever chills nausea vomiting chest pain.  Denies urinary or respiratory symptoms.  HPI   Prior to Admission medications   Medication Sig Start Date End Date Taking? Authorizing Provider  amLODipine (NORVASC) 5 MG tablet Take 5 mg by mouth daily. Reported on 08/09/2015   Yes [provider]  atorvastatin (LIPITOR) 40 MG tablet Take 1 tablet (40 mg total) by mouth daily at 6 PM. 08/09/15  Yes Steven Morales, Steven Back, MD  cetirizine (ZYRTEC) 10 MG tablet  10/03/15  Yes [provider]  Cholecalciferol (VITAMIN D-3) 1000 UNITS CAPS Take 1 capsule by mouth daily. Reported on 08/09/2015   Yes [provider]  FLUTICASONE-SALMETEROL IN Inhale into the lungs as needed.   Yes [provider]  ibuprofen (ADVIL,MOTRIN) 200 MG tablet Take 200 mg by mouth every 6 (six) hours as needed. Reported on 08/09/2015   Yes [provider]  levothyroxine (SYNTHROID, LEVOTHROID) 150 MCG tablet Take 1 tablet (150 mcg total) by mouth daily before breakfast. 08/10/15  Yes Steven Morales, Steven Back, MD  metoprolol tartrate (LOPRESSOR) 25 MG tablet Reported on 08/09/2015 04/10/15  Yes [provider]  Olopatadine HCl 0.2 % SOLN  10/29/15  Yes [provider]  simvastatin (ZOCOR) 40 MG tablet  10/29/15  Yes [provider]  cyclobenzaprine (FLEXERIL) 10 MG tablet Take 1 tablet (10 mg total) by mouth 3 (three) times daily as needed for muscle spasms. Patient not taking: Reported on 02/27/2017 10/31/15   Steven Mons, PA-C  furosemide (LASIX) 40 MG  tablet  10/03/15   [provider]  hydrochlorothiazide (HYDRODIURIL) 25 MG tablet  10/03/15   [provider]  ibuprofen (ADVIL,MOTRIN) 600 MG tablet Take 1 tablet (600 mg total) by mouth every 6 (six) hours as needed. Patient not taking: Reported on 02/27/2017 08/17/16   Steven Blank, MD  meloxicam (MOBIC) 15 MG tablet Take 1 tablet (15 mg total) by mouth daily. Patient not taking: Reported on 02/27/2017 10/31/15   Steven Mons, PA-C    No Known Allergies  Patient Active Problem List   Diagnosis Date Noted  . Degenerative disc disease, lumbar 10/31/2015  . Bradycardia 10/31/2015  . Pain in right elbow 11/29/2014  . Hypothyroidism following radioiodine therapy 06/24/2014    Past Medical History:  Diagnosis Date  . Allergy   . Hyperlipidemia   . Hypertension   . Hypothyroidism   . Memory loss     Past Surgical History:  Procedure Laterality Date  . Morales SURGERY      Social History   Socioeconomic History  . Marital status: Divorced    Spouse name: Not on file  . Number of children: 6  . Years of education: HS  . Highest education level: Not on file  Social Needs  . Financial resource strain: Not on file  . Food insecurity - worry: Not on file  . Food insecurity - inability: Not on file  . Transportation needs -  medical: Not on file  . Transportation needs - non-medical: Not on file  Occupational History    Employer: [employer] Morales    Comment: [employer]  Tobacco Use  . Smoking status: Never Smoker  . Smokeless tobacco: Never Used  Substance and Sexual Activity  . Alcohol use: No  . Drug use: No  . Sexual activity: Not on file  Other Topics Concern  . Not on file  Social History Narrative   Patient lives at home with his spouse.   Caffeine Use: 2-3 cups daily    Family History  Problem Relation Age of Onset  . Hypertension Father   . Thyroid disease Father   . Diabetes Father      Review of Systems  Constitutional:  Negative.  Negative for chills, fever and weight loss.  HENT: Negative.  Negative for sore throat.   Eyes: Negative.   Respiratory: Negative.  Negative for cough and hemoptysis.   Cardiovascular: Negative.  Negative for chest pain, palpitations and leg swelling.  Gastrointestinal: Negative for abdominal pain, diarrhea, nausea and vomiting.  Genitourinary: Negative for dysuria, flank pain and hematuria.  Musculoskeletal: Positive for joint pain and myalgias.  Skin: Negative for rash.  Neurological: Negative.  Negative for sensory change and focal weakness.  Endo/Heme/Allergies: Negative.   All other systems reviewed and are negative.  Vitals:   02/27/17 0808  BP: 108/66  Pulse: (!) 51  Resp: 16  Temp: 97.7 F (36.5 C)  SpO2: 98%     Physical Exam  Constitutional: He is oriented to person, place, and time. He appears Steven-developed and Steven-nourished.  HENT:  Head: Normocephalic and atraumatic.  Nose: Nose normal.  Mouth/Throat: Oropharynx is clear and moist.  Eyes: Conjunctivae and EOM are normal. Pupils are equal, round, and reactive to light.  Neck: Normal range of motion. Neck supple. No JVD present.  Cardiovascular: Normal rate, regular rhythm and normal heart sounds.  Pulmonary/Chest: Effort normal and breath sounds normal. He exhibits no tenderness.  Abdominal: Soft. Bowel sounds are normal. He exhibits no distension. There is no tenderness.  Musculoskeletal: Normal range of motion. He exhibits no edema or tenderness.  Lymphadenopathy:    He has no cervical adenopathy.  Neurological: He is alert and oriented to person, place, and time. He displays normal reflexes. No sensory deficit. He exhibits normal muscle tone.  Skin: Skin is warm and dry. Capillary refill takes less than 2 seconds. No rash noted.  Large lipoma to left lateral torso.  Nontender.  No signs of infection  Psychiatric: He has a normal mood and affect. His behavior is normal.  Vitals  reviewed.    ASSESSMENT & PLAN: Steven Morales was seen today for shoulder pain and mass.  Diagnoses and all orders for this visit:  Muscle pain -     CK -     diclofenac (VOLTAREN) 75 MG EC tablet; Take 1 tablet (75 mg total) by mouth 2 (two) times daily for 5 days.  Lipoma of torso  Bilateral arm pain -     CBC with Differential/Platelet -     Comprehensive metabolic panel    Patient Instructions       IF you received an x-ray today, you will receive an invoice from [facility]. Please contact [facility] at [phone] with questions or concerns regarding your invoice.   IF you received labwork today, you will receive an invoice from [place]. Please contact [facility] at [phone] with questions or concerns regarding your invoice.  Our billing staff will not be able to assist you with questions regarding bills from these companies.  You will be contacted with the lab results as soon as they are available. The fastest way to get your results is to activate your My Chart account. Instructions are located on the last page of this paperwork. If you have not heard from [place] regarding the results in 2 weeks, please contact this office.     Muscle Pain, Adult Muscle pain (myalgia) may be mild or severe. In most cases, the pain lasts only a short time and it goes away without treatment. It is normal to feel some muscle pain after starting a workout program. Muscles that have not been used often will be sore at first. Muscle pain may also be caused by many other things, including:  Overuse or muscle strain, especially if you are not in shape. This is the most common cause of muscle pain.  Injury.  Bruises.  Viruses, such as the flu.  Infectious diseases.  A chronic condition that causes muscle tenderness, fatigue, and headache (fibromyalgia).  A condition, such as lupus, in which the body's disease-fighting system attacks other organs in the body  (autoimmune or rheumatologic diseases).  Certain drugs, including ACE inhibitors and statins.  To diagnose the cause of your muscle pain, your health care provider will do a physical exam and ask questions about the pain and when it began. If you have not had muscle pain for very long, your health care provider may want to wait before doing much testing. If your muscle pain has lasted a long time, your health care provider may want to run tests right away. In some cases, this may include tests to rule out certain conditions or illnesses. Treatment for muscle pain depends on the cause. Home care is often enough to relieve muscle pain. Your health care provider may also prescribe anti-inflammatory medicine. Follow these instructions at home: Activity  If overuse is causing your muscle pain: ? Slow down your activities until the pain goes away. ? Do regular, gentle exercises if you are not usually active. ? Warm up before exercising. Stretch before and after exercising. This can help lower the risk of muscle pain.  Do not continue working out if the pain is very bad. Bad pain could mean that you have injured a muscle. Managing pain and discomfort   If directed, apply ice to the sore muscle: ? Put ice in a plastic bag. ? Place a towel between your skin and the bag. ? Leave the ice on for 20 minutes, 2-3 times a day.  You may also alternate between applying ice and applying heat as told by your health care provider. To apply heat, use the heat source that your health care provider recommends, such as a moist heat pack or a heating pad. ? Place a towel between your skin and the heat source. ? Leave the heat on for 20-30 minutes. ? Remove the heat if your skin turns bright red. This is especially important if you are unable to feel pain, heat, or cold. You may have a greater risk of getting burned. Medicines  Take over-the-counter and prescription medicines only as told by your health care  provider.  Do not drive or use heavy machinery while taking prescription pain medicine. Contact a health care provider if:  Your muscle pain gets worse and medicines do not help.  You have muscle pain that lasts longer than 3 days.  You have a  rash or fever along with muscle pain.  You have muscle pain after a tick bite.  You have muscle pain while working out, even though you are in good physical condition.  You have redness, soreness, or swelling along with muscle pain.  You have muscle pain after starting a new medicine or changing the dose of a medicine. Get help right away if:  You have trouble breathing.  You have trouble swallowing.  You have muscle pain along with a stiff neck, fever, and vomiting.  You have severe muscle weakness or cannot move part of your body. This information is not intended to replace advice given to you by your health care provider. Make sure you discuss any questions you have with your health care provider. Document Released: 12/07/2005 Document Revised: 08/05/2015 Document Reviewed: 06/07/2015 Elsevier Interactive Patient Education  2018 Elsevier Inc.      Agustina Caroli, MD [facility]

## 2017-02-28 ENCOUNTER — Encounter: Payer: Self-pay | Admitting: Radiology

## 2017-02-28 LAB — COMPREHENSIVE METABOLIC PANEL
ALBUMIN: 4.3 g/dL (ref 3.5–5.5)
ALT: 14 IU/L (ref 0–44)
AST: 22 IU/L (ref 0–40)
Albumin/Globulin Ratio: 1.8 (ref 1.2–2.2)
Alkaline Phosphatase: 54 IU/L (ref 39–117)
BUN / CREAT RATIO: 15 (ref 9–20)
BUN: 13 mg/dL (ref 6–24)
Bilirubin Total: 0.8 mg/dL (ref 0.0–1.2)
CALCIUM: 9.5 mg/dL (ref 8.7–10.2)
CO2: 23 mmol/L (ref 20–29)
Chloride: 105 mmol/L (ref 96–106)
Creatinine, Ser: 0.85 mg/dL (ref 0.76–1.27)
GFR calc Af Amer: 115 mL/min/{1.73_m2} (ref >59)
GFR, EST NON AFRICAN AMERICAN: 99 mL/min/{1.73_m2} (ref >59)
GLOBULIN, TOTAL: 2.4 g/dL (ref 1.5–4.5)
Glucose: 94 mg/dL (ref 65–99)
POTASSIUM: 4 mmol/L (ref 3.5–5.2)
SODIUM: 142 mmol/L (ref 134–144)
Total Protein: 6.7 g/dL (ref 6.0–8.5)

## 2017-02-28 LAB — CBC WITH DIFFERENTIAL/PLATELET
BASOS: 1 %
Basophils Absolute: 0 10*3/uL (ref 0.0–0.2)
EOS (ABSOLUTE): 0.1 10*3/uL (ref 0.0–0.4)
EOS: 3 %
HEMATOCRIT: 38.4 % (ref 37.5–51.0)
Hemoglobin: 12.4 g/dL — ABNORMAL LOW (ref 13.0–17.7)
IMMATURE GRANULOCYTES: 0 %
Immature Grans (Abs): 0 10*3/uL (ref 0.0–0.1)
LYMPHS ABS: 1 10*3/uL (ref 0.7–3.1)
Lymphs: 24 %
MCH: 30.6 pg (ref 26.6–33.0)
MCHC: 32.3 g/dL (ref 31.5–35.7)
MCV: 95 fL (ref 79–97)
MONOS ABS: 0.4 10*3/uL (ref 0.1–0.9)
Monocytes: 10 %
NEUTROS PCT: 62 %
Neutrophils Absolute: 2.6 10*3/uL (ref 1.4–7.0)
Platelets: 308 10*3/uL (ref 150–379)
RBC: 4.05 x10E6/uL — ABNORMAL LOW (ref 4.14–5.80)
RDW: 13.1 % (ref 12.3–15.4)
WBC: 4.2 10*3/uL (ref 3.4–10.8)

## 2017-02-28 LAB — CK: Total CK: 279 U/L — ABNORMAL HIGH (ref 24–204)

## 2017-03-22 ENCOUNTER — Ambulatory Visit: Payer: BLUE CROSS/BLUE SHIELD | Admitting: Emergency Medicine

## 2017-03-22 ENCOUNTER — Encounter: Payer: Self-pay | Admitting: Emergency Medicine

## 2017-03-22 VITALS — BP 132/78 | HR 48 | Temp 97.8°F | Resp 17 | Ht 71.5 in | Wt 187.0 lb

## 2017-03-22 DIAGNOSIS — M79601 Pain in right arm: Secondary | ICD-10-CM

## 2017-03-22 DIAGNOSIS — M79602 Pain in left arm: Secondary | ICD-10-CM | POA: Diagnosis not present

## 2017-03-22 DIAGNOSIS — M791 Myalgia, unspecified site: Secondary | ICD-10-CM

## 2017-03-22 MED ORDER — DICLOFENAC SODIUM 75 MG PO TBEC: 75.0000 mg | DELAYED_RELEASE_TABLET | Freq: Two times a day (BID) | ORAL | 0 refills | Status: AC

## 2017-03-22 NOTE — Progress Notes (Signed)
Steven Morales 52 y.o.   Chief Complaint  Patient presents with  . Arm Pain    HISTORY OF PRESENT ILLNESS: This is a 52 y.o. male complaining of bilateral arm muscle pain.  Denies any injury.  Seen by me January 30 this year for the same.  Diclofenac prescribed.  Worked well.  Blood work then showed mild increase of CPK.  Lab results reviewed with patient.  No new symptomatology.  Does very physical work every day.  HPI   Prior to Admission medications   Medication Sig Start Date End Date Taking? Authorizing Provider  amLODipine (NORVASC) 5 MG tablet Take 5 mg by mouth daily. Reported on 08/09/2015   Yes [provider]  atorvastatin (LIPITOR) 40 MG tablet Take 1 tablet (40 mg total) by mouth daily at 6 PM. 08/09/15  Yes Daub, Loura Back, MD  cetirizine (ZYRTEC) 10 MG tablet  10/03/15  Yes [provider]  Cholecalciferol (VITAMIN D-3) 1000 UNITS CAPS Take 1 capsule by mouth daily. Reported on 08/09/2015   Yes [provider]  cyclobenzaprine (FLEXERIL) 10 MG tablet Take 1 tablet (10 mg total) by mouth 3 (three) times daily as needed for muscle spasms. 10/31/15  Yes Jeffery, Chelle, PA-C  FLUTICASONE-SALMETEROL IN Inhale into the lungs as needed.   Yes [provider]  furosemide (LASIX) 40 MG tablet  10/03/15  Yes [provider]  hydrochlorothiazide (HYDRODIURIL) 25 MG tablet  10/03/15  Yes [provider]  ibuprofen (ADVIL,MOTRIN) 200 MG tablet Take 200 mg by mouth every 6 (six) hours as needed. Reported on 08/09/2015   Yes [provider]  ibuprofen (ADVIL,MOTRIN) 600 MG tablet Take 1 tablet (600 mg total) by mouth every 6 (six) hours as needed. 08/17/16  Yes Cardama, Grayce Sessions, MD  levothyroxine (SYNTHROID, LEVOTHROID) 150 MCG tablet Take 1 tablet (150 mcg total) by mouth daily before breakfast. 08/10/15  Yes Daub, Loura Back, MD  meloxicam (MOBIC) 15 MG tablet Take 1 tablet (15 mg total) by mouth daily. 10/31/15  Yes Jeffery, Chelle,  PA-C  metoprolol tartrate (LOPRESSOR) 25 MG tablet Reported on 08/09/2015 04/10/15  Yes [provider]  Olopatadine HCl 0.2 % SOLN  10/29/15  Yes [provider]  simvastatin (ZOCOR) 40 MG tablet  10/29/15  Yes [provider]    No Known Allergies  Patient Active Problem List   Diagnosis Date Noted  . Muscle pain 02/27/2017  . 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 Bilateral arm pain 02/27/2017  . Degenerative disc disease, lumbar 10/31/2015  . Bradycardia 10/31/2015  . Pain in right elbow 11/29/2014  . Hypothyroidism following radioiodine therapy 06/24/2014    Past Medical History:  Diagnosis Date  . Allergy   . Hyperlipidemia   . Hypertension   . Hypothyroidism   . Memory loss     Past Surgical History:  Procedure Laterality Date  . BACK SURGERY      Social History   Socioeconomic History  . Marital status: Divorced    Spouse name: Not on file  . Number of children: 6  . Years of education: HS  . Highest education level: Not on file  Social Needs  . Financial resource strain: Not on file  . Food insecurity - worry: Not on file  . Food insecurity - inability: Not on file  . Transportation needs - medical: Not on file  . Transportation needs - non-medical: Not on file  Occupational History    Employer: [employer]  Comment: [place]  Tobacco Use  . Smoking status: Never Smoker  . Smokeless tobacco: Never Used  Substance and Sexual Activity  . Alcohol use: No  . Drug use: No  . Sexual activity: Not on file  Other Topics Concern  . Not on file  Social History Narrative   Patient lives at home with his spouse.   Caffeine Use: 2-3 cups daily    Family History  Problem Relation Age of Onset  . Hypertension Father   . Thyroid disease Father   . Diabetes Father      Review of Systems  Constitutional: Negative.  Negative for chills, fever and weight loss.  HENT: Negative.  Negative for congestion and sore throat.     Eyes: Negative.  Negative for blurred vision and double vision.  Respiratory: Negative.  Negative for cough and shortness of breath.   Cardiovascular: Negative.  Negative for chest pain, palpitations and leg swelling.  Gastrointestinal: Negative.  Negative for abdominal pain, nausea and vomiting.  Genitourinary: Negative.  Negative for dysuria and hematuria.  Musculoskeletal: Negative.  Negative for back pain, myalgias and neck pain.  Skin: Negative.  Negative for rash.  Neurological: Negative.  Negative for dizziness and headaches.  Endo/Heme/Allergies: Negative.   All other systems reviewed and are negative.  Vitals:   03/22/17 0813  BP: 132/78  Pulse: (!) 48  Resp: 17  Temp: 97.8 F (36.6 C)  SpO2: 98%     Physical Exam  Constitutional: He is oriented to person, place, and time. He appears well-developed and well-nourished.  HENT:  Head: Normocephalic and atraumatic.  Nose: Nose normal.  Mouth/Throat: Oropharynx is clear and moist.  Eyes: Conjunctivae and EOM are normal. Pupils are equal, round, and reactive to light.  Neck: Normal range of motion. Neck supple. No thyromegaly present.  Cardiovascular: Normal rate, regular rhythm and normal heart sounds.  Pulmonary/Chest: Effort normal and breath sounds normal.  Abdominal: Soft. Bowel sounds are normal. He exhibits no distension. There is no tenderness.  Musculoskeletal: Normal range of motion. He exhibits no edema or tenderness.  Upper extremities: Full range of motion, no muscle tenderness, no erythema, no crepitation.  NVI. Rest of the extremities: Within normal limits.  Lymphadenopathy:    He has no cervical adenopathy.  Neurological: He is alert and oriented to person, place, and time. No sensory deficit. He exhibits normal muscle tone.  Skin: Skin is warm and dry. Capillary refill takes less than 2 seconds.  Psychiatric: He has a normal mood and affect. His behavior is normal.  Vitals reviewed.   A total of 25  minutes was spent in the room with the patient, greater than 50% of which was in counseling/coordination of care.  ASSESSMENT & PLAN: Steven Morales was seen today for arm pain.  Diagnoses and all orders for this visit:  Muscle pain -     diclofenac (VOLTAREN) 75 MG EC tablet; Take 1 tablet (75 mg total) by mouth 2 (two) times daily for 5 days.  Bilateral arm pain    Patient Instructions       IF you received an x-ray today, you will receive an invoice from [facility]. Please contact [facility] at [phone] with questions or concerns regarding your invoice.   IF you received labwork today, you will receive an invoice from [place]. Please contact [facility] at [phone] with questions or concerns regarding your invoice.   Our billing staff will not be able to assist you with questions regarding bills from these  companies.  You will be contacted with the lab results as soon as they are available. The fastest way to get your results is to activate your My Chart account. Instructions are located on the last page of this paperwork. If you have not heard from [place] regarding the results in 2 weeks, please contact this office.      Muscle Pain, Adult Muscle pain (myalgia) may be mild or severe. In most cases, the pain lasts only a short time and it goes away without treatment. It is normal to feel some muscle pain after starting a workout program. Muscles that have not been used often will be sore at first. Muscle pain may also be caused by many other things, including:  Overuse or muscle strain, especially if you are not in shape. This is the most common cause of muscle pain.  Injury.  Bruises.  Viruses, such as the flu.  Infectious diseases.  A chronic condition that causes muscle tenderness, fatigue, and headache (fibromyalgia).  A condition, such as lupus, in which the body's disease-fighting system attacks other organs in the body (autoimmune or  rheumatologic diseases).  Certain drugs, including ACE inhibitors and statins.  To diagnose the cause of your muscle pain, your health care provider will do a physical exam and ask questions about the pain and when it began. If you have not had muscle pain for very long, your health care provider may want to wait before doing much testing. If your muscle pain has lasted a long time, your health care provider may want to run tests right away. In some cases, this may include tests to rule out certain conditions or illnesses. Treatment for muscle pain depends on the cause. Home care is often enough to relieve muscle pain. Your health care provider may also prescribe anti-inflammatory medicine. Follow these instructions at home: Activity  If overuse is causing your muscle pain: ? Slow down your activities until the pain goes away. ? Do regular, gentle exercises if you are not usually active. ? Warm up before exercising. Stretch before and after exercising. This can help lower the risk of muscle pain.  Do not continue working out if the pain is very bad. Bad pain could mean that you have injured a muscle. Managing pain and discomfort   If directed, apply ice to the sore muscle: ? Put ice in a plastic bag. ? Place a towel between your skin and the bag. ? Leave the ice on for 20 minutes, 2-3 times a day.  You may also alternate between applying ice and applying heat as told by your health care provider. To apply heat, use the heat source that your health care provider recommends, such as a moist heat pack or a heating pad. ? Place a towel between your skin and the heat source. ? Leave the heat on for 20-30 minutes. ? Remove the heat if your skin turns bright red. This is especially important if you are unable to feel pain, heat, or cold. You may have a greater risk of getting burned. Medicines  Take over-the-counter and prescription medicines only as told by your health care provider.  Do not  drive or use heavy machinery while taking prescription pain medicine. Contact a health care provider if:  Your muscle pain gets worse and medicines do not help.  You have muscle pain that lasts longer than 3 days.  You have a rash or fever along with muscle pain.  You have muscle pain after a tick  bite.  You have muscle pain while working out, even though you are in good physical condition.  You have redness, soreness, or swelling along with muscle pain.  You have muscle pain after starting a new medicine or changing the dose of a medicine. Get help right away if:  You have trouble breathing.  You have trouble swallowing.  You have muscle pain along with a stiff neck, fever, and vomiting.  You have severe muscle weakness or cannot move part of your body. This information is not intended to replace advice given to you by your health care provider. Make sure you discuss any questions you have with your health care provider. Document Released: 12/07/2005 Document Revised: 08/05/2015 Document Reviewed: 06/07/2015 Elsevier Interactive Patient Education  2018 Elsevier Inc.      Agustina Caroli, MD [facility]

## 2017-03-22 NOTE — Patient Instructions (Addendum)
IF you received an x-ray today, you will receive an invoice from [facility]. Please contact [facility] at [phone] with questions or concerns regarding your invoice.   IF you received labwork today, you will receive an invoice from [place]. Please contact [facility] at [phone] with questions or concerns regarding your invoice.   Our billing staff will not be able to assist you with questions regarding bills from these companies.  You will be contacted with the lab results as soon as they are available. The fastest way to get your results is to activate your My Chart account. Instructions are located on the last page of this paperwork. If you have not heard from [place] regarding the results in 2 weeks, please contact this office.      Muscle Pain, Adult Muscle pain (myalgia) may be mild or severe. In most cases, the pain lasts only a short time and it goes away without treatment. It is normal to feel some muscle pain after starting a workout program. Muscles that have not been used often will be sore at first. Muscle pain may also be caused by many other things, including:  Overuse or muscle strain, especially if you are not in shape. This is the most common cause of muscle pain.  Injury.  Bruises.  Viruses, such as the flu.  Infectious diseases.  A chronic condition that causes muscle tenderness, fatigue, and headache (fibromyalgia).  A condition, such as lupus, in which the body's disease-fighting system attacks other organs in the body (autoimmune or rheumatologic diseases).  Certain drugs, including ACE inhibitors and statins.  To diagnose the cause of your muscle pain, your health care provider will do a physical exam and ask questions about the pain and when it began. If you have not had muscle pain for very long, your health care provider may want to wait before doing much testing. If your muscle pain has lasted a long time, your health care  provider may want to run tests right away. In some cases, this may include tests to rule out certain conditions or illnesses. Treatment for muscle pain depends on the cause. Home care is often enough to relieve muscle pain. Your health care provider may also prescribe anti-inflammatory medicine. Follow these instructions at home: Activity  If overuse is causing your muscle pain: ? Slow down your activities until the pain goes away. ? Do regular, gentle exercises if you are not usually active. ? Warm up before exercising. Stretch before and after exercising. This can help lower the risk of muscle pain.  Do not continue working out if the pain is very bad. Bad pain could mean that you have injured a muscle. Managing pain and discomfort   If directed, apply ice to the sore muscle: ? Put ice in a plastic bag. ? Place a towel between your skin and the bag. ? Leave the ice on for 20 minutes, 2-3 times a day.  You may also alternate between applying ice and applying heat as told by your health care provider. To apply heat, use the heat source that your health care provider recommends, such as a moist heat pack or a heating pad. ? Place a towel between your skin and the heat source. ? Leave the heat on for 20-30 minutes. ? Remove the heat if your skin turns bright red. This is especially important if you are unable to feel pain, heat, or cold. You may have a greater risk of getting burned. Medicines  Take over-the-counter and prescription medicines only as told by your health care provider.  Do not drive or use heavy machinery while taking prescription pain medicine. Contact a health care provider if:  Your muscle pain gets worse and medicines do not help.  You have muscle pain that lasts longer than 3 days.  You have a rash or fever along with muscle pain.  You have muscle pain after a tick bite.  You have muscle pain while working out, even though you are in good physical  condition.  You have redness, soreness, or swelling along with muscle pain.  You have muscle pain after starting a new medicine or changing the dose of a medicine. Get help right away if:  You have trouble breathing.  You have trouble swallowing.  You have muscle pain along with a stiff neck, fever, and vomiting.  You have severe muscle weakness or cannot move part of your body. This information is not intended to replace advice given to you by your health care provider. Make sure you discuss any questions you have with your health care provider. Document Released: 12/07/2005 Document Revised: 08/05/2015 Document Reviewed: 06/07/2015 [employer] Interactive Patient Education  2018 [employer].

## 2017-07-29 ENCOUNTER — Ambulatory Visit: Payer: BLUE CROSS/BLUE SHIELD | Admitting: Emergency Medicine

## 2017-07-29 ENCOUNTER — Encounter: Payer: Self-pay | Admitting: Emergency Medicine

## 2017-07-29 ENCOUNTER — Other Ambulatory Visit: Payer: Self-pay

## 2017-07-29 VITALS — BP 148/79 | HR 44 | Temp 97.8°F | Resp 16 | Ht 71.0 in | Wt 187.8 lb

## 2017-07-29 DIAGNOSIS — I10 Essential (primary) hypertension: Secondary | ICD-10-CM

## 2017-07-29 DIAGNOSIS — R001 Bradycardia, unspecified: Secondary | ICD-10-CM

## 2017-07-29 DIAGNOSIS — Z23 Encounter for immunization: Secondary | ICD-10-CM | POA: Diagnosis not present

## 2017-07-29 NOTE — Progress Notes (Signed)
Steven Morales 52 y.o.   Chief Complaint  Patient presents with  . Hypertension    per patient wants to check low pulse, it was low 38 07/28/17    HISTORY OF PRESENT ILLNESS: This is a 52 y.o. male with history of hypertension complaining of low heart rate.  Patient taking amlodipine and Lopressor.  Yesterday his pulse was 38.  Today was 44.  Asymptomatic.  Denies any episodes of chest pain, difficulty breathing, syncope, neck pain, diaphoresis or any other significant symptomatology.  Did not take medications this morning.  HPI   Prior to Admission medications   Medication Sig Start Date End Date Taking? Authorizing Provider  amLODipine (NORVASC) 5 MG tablet Take 5 mg by mouth daily. Reported on 08/09/2015   Yes [provider]  cetirizine (ZYRTEC) 10 MG tablet  10/03/15  Yes [provider]  Cholecalciferol (VITAMIN D-3) 1000 UNITS CAPS Take 1 capsule by mouth daily. Reported on 08/09/2015   Yes [provider]  FLUTICASONE-SALMETEROL IN Inhale into the lungs as needed.   Yes [provider]  furosemide (LASIX) 40 MG tablet  10/03/15  Yes [provider]  hydrochlorothiazide (HYDRODIURIL) 25 MG tablet  10/03/15  Yes [provider]  levothyroxine (SYNTHROID, LEVOTHROID) 150 MCG tablet Take 1 tablet (150 mcg total) by mouth daily before breakfast. 08/10/15  Yes Daub, Loura Back, MD  meloxicam (MOBIC) 15 MG tablet Take 1 tablet (15 mg total) by mouth daily. 10/31/15  Yes Jeffery, Chelle, PA-C  Olopatadine HCl 0.2 % SOLN  10/29/15  Yes [provider]  simvastatin (ZOCOR) 40 MG tablet  10/29/15  Yes [provider]  atorvastatin (LIPITOR) 40 MG tablet Take 1 tablet (40 mg total) by mouth daily at 6 PM. Patient not taking: Reported on 07/29/2017 08/09/15   Darlyne Russian, MD  cyclobenzaprine (FLEXERIL) 10 MG tablet Take 1 tablet (10 mg total) by mouth 3 (three) times daily as needed for muscle spasms. Patient not taking: Reported on  07/29/2017 10/31/15   Harrison Mons, PA-C  ibuprofen (ADVIL,MOTRIN) 200 MG tablet Take 200 mg by mouth every 6 (six) hours as needed. Reported on 08/09/2015    [provider]  ibuprofen (ADVIL,MOTRIN) 600 MG tablet Take 1 tablet (600 mg total) by mouth every 6 (six) hours as needed. 08/17/16   Fatima Blank, MD  metoprolol tartrate (LOPRESSOR) 25 MG tablet Reported on 08/09/2015 04/10/15   [provider]    No Known Allergies  Patient Active Problem List   Diagnosis Date Noted  . Muscle pain 02/27/2017  . 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 Bilateral arm pain 02/27/2017  . Degenerative disc disease, lumbar 10/31/2015  . Bradycardia 10/31/2015  . Pain in right elbow 11/29/2014  . Hypothyroidism following radioiodine therapy 06/24/2014    Past Medical History:  Diagnosis Date  . Allergy   . Hyperlipidemia   . Hypertension   . Hypothyroidism   . Memory loss     Past Surgical History:  Procedure Laterality Date  . BACK SURGERY      Social History   Socioeconomic History  . Marital status: Divorced    Spouse name: Not on file  . Number of children: 6  . Years of education: HS  . Highest education level: Not on file  Occupational History    Employer: [employer]    Comment: [place] [employer]  Social Needs  . Financial resource strain: Not on file  . Food insecurity:    Worry:  Not on file    Inability: Not on file  . Transportation needs:    Medical: Not on file    Non-medical: Not on file  Tobacco Use  . Smoking status: Never Smoker  . Smokeless tobacco: Never Used  Substance and Sexual Activity  . Alcohol use: No  . Drug use: No  . Sexual activity: Not on file  Lifestyle  . Physical activity:    Days per week: Not on file    Minutes per session: Not on file  . Stress: Not on file  Relationships  . Social connections:    Talks on phone: Not on file    Gets together: Not on file    Attends religious service: Not on file    Active  member of club or organization: Not on file    Attends meetings of clubs or organizations: Not on file    Relationship status: Not on file  . Intimate partner violence:    Fear of current or ex partner: Not on file    Emotionally abused: Not on file    Physically abused: Not on file    Forced sexual activity: Not on file  Other Topics Concern  . Not on file  Social History Narrative   Patient lives at home with his spouse.   Caffeine Use: 2-3 cups daily    Family History  Problem Relation Age of Onset  . Hypertension Father   . Thyroid disease Father   . Diabetes Father      Review of Systems  Constitutional: Negative.   HENT: Negative.  Negative for congestion, nosebleeds and sore throat.   Eyes: Negative.  Negative for blurred vision and double vision.  Respiratory: Negative.  Negative for cough and shortness of breath.   Cardiovascular: Negative.  Negative for chest pain, palpitations and leg swelling.  Gastrointestinal: Negative.  Negative for abdominal pain, diarrhea, nausea and vomiting.  Genitourinary: Negative.  Negative for dysuria and hematuria.  Musculoskeletal: Negative.  Negative for back pain, myalgias and neck pain.  Skin: Negative.  Negative for rash.  Neurological: Negative.  Negative for dizziness, speech change, focal weakness, seizures, loss of consciousness and headaches.  Endo/Heme/Allergies: Negative.   All other systems reviewed and are negative.   Vitals:   07/29/17 1216  BP: (!) 148/79  Pulse: (!) 44  Resp: 16  Temp: 97.8 F (36.6 C)  SpO2: 97%    Physical Exam  Constitutional: He is oriented to person, place, and time. He appears well-developed and well-nourished.  HENT:  Head: Normocephalic.  Nose: Nose normal.  Mouth/Throat: Oropharynx is clear and moist.  Eyes: Pupils are equal, round, and reactive to light. Conjunctivae and EOM are normal.  Neck: Normal range of motion. Neck supple. No JVD present. No thyromegaly present.    Cardiovascular: Regular rhythm and normal heart sounds. Bradycardia present.  No murmur heard. Pulmonary/Chest: Effort normal and breath sounds normal. No respiratory distress.  Abdominal: Soft. There is no tenderness.  Musculoskeletal: Normal range of motion. He exhibits no edema or tenderness.  Lymphadenopathy:    He has no cervical adenopathy.  Neurological: He is alert and oriented to person, place, and time. No sensory deficit. He exhibits normal muscle tone.  Skin: Skin is warm and dry. Capillary refill takes less than 2 seconds. No rash noted.  Psychiatric: He has a normal mood and affect. His behavior is normal.  Vitals reviewed.  EKG: Sinus bradycardia at a rate of 38/min.  No acute ischemic changes.  ASSESSMENT & PLAN: [place] was seen today for hypertension.  Diagnoses and all orders for this visit:  Sinus bradycardia -     EKG 12-Lead  Need for diphtheria-tetanus-pertussis (Tdap) vaccine -     Tdap vaccine greater than or equal to 7yo IM  Essential hypertension    Patient Instructions   Stop Lopressor. Bradycardia, Adult Bradycardia is a slower-than-normal heartbeat. A normal resting heart rate for an adult ranges from 60 to 100 beats per minute. With bradycardia, the resting heart rate is less than 60 beats per minute. Bradycardia can prevent enough oxygen from reaching certain areas of your body when you are active. It can be serious if it keeps enough oxygen from reaching your brain and other parts of your body. Bradycardia is not a problem for everyone. For some healthy adults, a slow resting heart rate is normal. What are the causes? This condition may be caused by:  A problem with the heart, including: ? A problem with the heart's electrical system, such as a heart block. ? A problem with the heart's natural pacemaker (sinus node). ? Heart disease. ? A heart attack. ? Heart damage. ? A heart infection. ? A heart condition that is present at birth  (congenital heart defect).  Certain medicines that treat heart conditions.  Certain conditions, such as hypothyroidism and obstructive sleep apnea.  Problems with the balance of chemicals and other substances, like potassium, in the blood.  What increases the risk? This condition is more likely to develop in adults who:  Are age 60 or older.  Have high blood pressure (hypertension), high cholesterol (hyperlipidemia), or diabetes.  Drink heavily, use tobacco or nicotine products, or use drugs.  Are stressed.  What are the signs or symptoms? Symptoms of this condition include:  Light-headedness.  Feeling faint or fainting.  Fatigue and weakness.  Shortness of breath.  Chest pain (angina).  Drowsiness.  Confusion.  Dizziness.  How is this diagnosed? This condition may be diagnosed based on:  Your symptoms.  Your medical history.  A physical exam.  During the exam, your health care provider will listen to your heartbeat and check your pulse. To confirm the diagnosis, your health care provider may order tests, such as:  Blood tests.  An electrocardiogram (ECG). This test records the heart's electrical activity. The test can show how fast your heart is beating and whether the heartbeat is steady.  A test in which you wear a portable device (event recorder or Holter monitor) to record your heart's electrical activity while you go about your day.  Anexercise test.  How is this treated? Treatment for this condition depends on the cause of the condition and how severe your symptoms are. Treatment may involve:  Treatment of the underlying condition.  Changing your medicines or how much medicine you take.  Having a small, battery-operated device called a pacemaker implanted under the skin. When bradycardia occurs, this device can be used to increase your heart rate and help your heart to beat in a regular rhythm.  Follow these instructions at  home: Lifestyle   Manage any health conditions that contribute to bradycardia as told by your health care provider.  Follow a heart-healthy diet. A nutrition specialist (dietitian) can help to educate you about healthy food options and changes.  Follow an exercise program that is approved by your health care provider.  Maintain a healthy weight.  Try to reduce or manage your stress, such as with yoga or meditation. If you need  help reducing stress, ask your health care provider.  Do not use use any products that contain nicotine or tobacco, such as cigarettes and e-cigarettes. If you need help quitting, ask your health care provider.  Do not use illegal drugs.  Limit alcohol intake to no more than 1 drink per day for nonpregnant women and 2 drinks per day for men. One drink equals 12 oz of beer, 5 oz of wine, or 1 oz of hard liquor. General instructions  Take over-the-counter and prescription medicines only as told by your health care provider.  Keep all follow-up visits as directed by your health care provider. This is important. How is this prevented? In some cases, bradycardia may be prevented by:  Treating underlying medical problems.  Stopping behaviors or medicines that can trigger the condition.  Contact a health care provider if:  You feel light-headed or dizzy.  You almost faint.  You feel weak or are easily fatigued during physical activity.  You experience confusion or have memory problems. Get help right away if:  You faint.  You have an irregular heartbeat (palpitations).  You have chest pain.  You have trouble breathing. This information is not intended to replace advice given to you by your health care provider. Make sure you discuss any questions you have with your health care provider. Document Released: 10/07/2001 Document Revised: 09/13/2015 Document Reviewed: 07/07/2015 Elsevier Interactive Patient Education  2017 [employer]  Hypertension Hypertension, commonly called high blood pressure, is when the force of blood pumping through the arteries is too strong. The arteries are the blood vessels that carry blood from the heart throughout the body. Hypertension forces the heart to work harder to pump blood and may cause arteries to become narrow or stiff. Having untreated or uncontrolled hypertension can cause heart attacks, strokes, kidney disease, and other problems. A blood pressure reading consists of a higher number over a lower number. Ideally, your blood pressure should be below 120/80. The first ("top") number is called the systolic pressure. It is a measure of the pressure in your arteries as your heart beats. The second ("bottom") number is called the diastolic pressure. It is a measure of the pressure in your arteries as the heart relaxes. What are the causes? The cause of this condition is not known. What increases the risk? Some risk factors for high blood pressure are under your control. Others are not. Factors you can change  Smoking.  Having type 2 diabetes mellitus, high cholesterol, or both.  Not getting enough exercise or physical activity.  Being overweight.  Having too much fat, sugar, calories, or salt (sodium) in your diet.  Drinking too much alcohol. Factors that are difficult or impossible to change  Having chronic kidney disease.  Having a family history of high blood pressure.  Age. Risk increases with age.  Race. You may be at higher risk if you are African-American.  Gender. Men are at higher risk than women before age 85. After age 21, women are at higher risk than men.  Having obstructive sleep apnea.  Stress. What are the signs or symptoms? Extremely high blood pressure (hypertensive crisis) may cause:  Headache.  Anxiety.  Shortness of breath.  Nosebleed.  Nausea and vomiting.  Severe chest pain.  Jerky movements you cannot control (seizures).  How  is this diagnosed? This condition is diagnosed by measuring your blood pressure while you are seated, with your arm resting on a surface. The cuff of the blood pressure monitor will  be placed directly against the skin of your upper arm at the level of your heart. It should be measured at least twice using the same arm. Certain conditions can cause a difference in blood pressure between your right and left arms. Certain factors can cause blood pressure readings to be lower or higher than normal (elevated) for a short period of time:  When your blood pressure is higher when you are in a health care provider's office than when you are at home, this is called white coat hypertension. Most people with this condition do not need medicines.  When your blood pressure is higher at home than when you are in a health care provider's office, this is called masked hypertension. Most people with this condition may need medicines to control blood pressure.  If you have a high blood pressure reading during one visit or you have normal blood pressure with other risk factors:  You may be asked to return on a different day to have your blood pressure checked again.  You may be asked to monitor your blood pressure at home for 1 week or longer.  If you are diagnosed with hypertension, you may have other blood or imaging tests to help your health care provider understand your overall risk for other conditions. How is this treated? This condition is treated by making healthy lifestyle changes, such as eating healthy foods, exercising more, and reducing your alcohol intake. Your health care provider may prescribe medicine if lifestyle changes are not enough to get your blood pressure under control, and if:  Your systolic blood pressure is above 130.  Your diastolic blood pressure is above 80.  Your personal target blood pressure may vary depending on your medical conditions, your age, and other factors. Follow these  instructions at home: Eating and drinking  Eat a diet that is high in fiber and potassium, and low in sodium, added sugar, and fat. An example eating plan is called the DASH (Dietary Approaches to Stop Hypertension) diet. To eat this way: ? Eat plenty of fresh fruits and vegetables. Try to fill half of your plate at each meal with fruits and vegetables. ? Eat whole grains, such as whole wheat pasta, brown rice, or whole grain bread. Fill about one quarter of your plate with whole grains. ? Eat or drink low-fat dairy products, such as skim milk or low-fat yogurt. ? Avoid fatty cuts of meat, processed or cured meats, and poultry with skin. Fill about one quarter of your plate with lean proteins, such as fish, chicken without skin, beans, eggs, and tofu. ? Avoid premade and processed foods. These tend to be higher in sodium, added sugar, and fat.  Reduce your daily sodium intake. Most people with hypertension should eat less than 1,500 mg of sodium a day.  Limit alcohol intake to no more than 1 drink a day for nonpregnant women and 2 drinks a day for men. One drink equals 12 oz of beer, 5 oz of wine, or 1 oz of hard liquor. Lifestyle  Work with your health care provider to maintain a healthy body weight or to lose weight. Ask what an ideal weight is for you.  Get at least 30 minutes of exercise that causes your heart to beat faster (aerobic exercise) most days of the week. Activities may include walking, swimming, or biking.  Include exercise to strengthen your muscles (resistance exercise), such as pilates or lifting weights, as part of your weekly exercise routine. Try to do these  types of exercises for 30 minutes at least 3 days a week.  Do not use any products that contain nicotine or tobacco, such as cigarettes and e-cigarettes. If you need help quitting, ask your health care provider.  Monitor your blood pressure at home as told by your health care provider.  Keep all follow-up visits as  told by your health care provider. This is important. Medicines  Take over-the-counter and prescription medicines only as told by your health care provider. Follow directions carefully. Blood pressure medicines must be taken as prescribed.  Do not skip doses of blood pressure medicine. Doing this puts you at risk for problems and can make the medicine less effective.  Ask your health care provider about side effects or reactions to medicines that you should watch for. Contact a health care provider if:  You think you are having a reaction to a medicine you are taking.  You have headaches that keep coming back (recurring).  You feel dizzy.  You have swelling in your ankles.  You have trouble with your vision. Get help right away if:  You develop a severe headache or confusion.  You have unusual weakness or numbness.  You feel faint.  You have severe pain in your chest or abdomen.  You vomit repeatedly.  You have trouble breathing. Summary  Hypertension is when the force of blood pumping through your arteries is too strong. If this condition is not controlled, it may put you at risk for serious complications.  Your personal target blood pressure may vary depending on your medical conditions, your age, and other factors. For most people, a normal blood pressure is less than 120/80.  Hypertension is treated with lifestyle changes, medicines, or a combination of both. Lifestyle changes include weight loss, eating a healthy, low-sodium diet, exercising more, and limiting alcohol. This information is not intended to replace advice given to you by your health care provider. Make sure you discuss any questions you have with your health care provider. Document Released: 01/15/2005 Document Revised: 12/14/2015 Document Reviewed: 12/14/2015 Elsevier Interactive Patient Education  2018 [employer].     IF you received an x-ray today, you will receive an invoice from [facility]  [facility]. Please contact [facility] at [phone] with questions or concerns regarding your invoice.   IF you received labwork today, you will receive an invoice from [facility]. Please contact [facility] at [phone] with questions or concerns regarding your invoice.   Our billing staff will not be able to assist you with questions regarding bills from these companies.  You will be contacted with the lab results as soon as they are available. The fastest way to get your results is to activate your My Chart account. Instructions are located on the last page of this paperwork. If you have not heard from [place] regarding the results in 2 weeks, please contact this office.         Agustina Caroli, MD [facility]

## 2017-07-29 NOTE — Patient Instructions (Addendum)
Stop Lopressor. Bradycardia, Adult Bradycardia is a slower-than-normal heartbeat. A normal resting heart rate for an adult ranges from 60 to 100 beats per minute. With bradycardia, the resting heart rate is less than 60 beats per minute. Bradycardia can prevent enough oxygen from reaching certain areas of your body when you are active. It can be serious if it keeps enough oxygen from reaching your brain and other parts of your body. Bradycardia is not a problem for everyone. For some healthy adults, a slow resting heart rate is normal. What are the causes? This condition may be caused by:  A problem with the heart, including: ? A problem with the heart's electrical system, such as a heart block. ? A problem with the heart's natural pacemaker (sinus node). ? Heart disease. ? A heart attack. ? Heart damage. ? A heart infection. ? A heart condition that is present at birth (congenital heart defect).  Certain medicines that treat heart conditions.  Certain conditions, such as hypothyroidism and obstructive sleep apnea.  Problems with the balance of chemicals and other substances, like potassium, in the blood.  What increases the risk? This condition is more likely to develop in adults who:  Are age 53 or older.  Have high blood pressure (hypertension), high cholesterol (hyperlipidemia), or diabetes.  Drink heavily, use tobacco or nicotine products, or use drugs.  Are stressed.  What are the signs or symptoms? Symptoms of this condition include:  Light-headedness.  Feeling faint or fainting.  Fatigue and weakness.  Shortness of breath.  Chest pain (angina).  Drowsiness.  Confusion.  Dizziness.  How is this diagnosed? This condition may be diagnosed based on:  Your symptoms.  Your medical history.  A physical exam.  During the exam, your health care provider will listen to your heartbeat and check your pulse. To confirm the diagnosis, your health care provider  may order tests, such as:  Blood tests.  An electrocardiogram (ECG). This test records the heart's electrical activity. The test can show how fast your heart is beating and whether the heartbeat is steady.  A test in which you wear a portable device (event recorder or Holter monitor) to record your heart's electrical activity while you go about your day.  Anexercise test.  How is this treated? Treatment for this condition depends on the cause of the condition and how severe your symptoms are. Treatment may involve:  Treatment of the underlying condition.  Changing your medicines or how much medicine you take.  Having a small, battery-operated device called a pacemaker implanted under the skin. When bradycardia occurs, this device can be used to increase your heart rate and help your heart to beat in a regular rhythm.  Follow these instructions at home: Lifestyle   Manage any health conditions that contribute to bradycardia as told by your health care provider.  Follow a heart-healthy diet. A nutrition specialist (dietitian) can help to educate you about healthy food options and changes.  Follow an exercise program that is approved by your health care provider.  Maintain a healthy weight.  Try to reduce or manage your stress, such as with yoga or meditation. If you need help reducing stress, ask your health care provider.  Do not use use any products that contain nicotine or tobacco, such as cigarettes and e-cigarettes. If you need help quitting, ask your health care provider.  Do not use illegal drugs.  Limit alcohol intake to no more than 1 drink per day for nonpregnant women and 2  drinks per day for men. One drink equals 12 oz of beer, 5 oz of wine, or 1 oz of hard liquor. General instructions  Take over-the-counter and prescription medicines only as told by your health care provider.  Keep all follow-up visits as directed by your health care provider. This is  important. How is this prevented? In some cases, bradycardia may be prevented by:  Treating underlying medical problems.  Stopping behaviors or medicines that can trigger the condition.  Contact a health care provider if:  You feel light-headed or dizzy.  You almost faint.  You feel weak or are easily fatigued during physical activity.  You experience confusion or have memory problems. Get help right away if:  You faint.  You have an irregular heartbeat (palpitations).  You have chest pain.  You have trouble breathing. This information is not intended to replace advice given to you by your health care provider. Make sure you discuss any questions you have with your health care provider. Document Released: 10/07/2001 Document Revised: 09/13/2015 Document Reviewed: 07/07/2015 Elsevier Interactive Patient Education  2017 [employer]  Hypertension Hypertension, commonly called high blood pressure, is when the force of blood pumping through the arteries is too strong. The arteries are the blood vessels that carry blood from the heart throughout the body. Hypertension forces the heart to work harder to pump blood and may cause arteries to become narrow or stiff. Having untreated or uncontrolled hypertension can cause heart attacks, strokes, kidney disease, and other problems. A blood pressure reading consists of a higher number over a lower number. Ideally, your blood pressure should be below 120/80. The first ("top") number is called the systolic pressure. It is a measure of the pressure in your arteries as your heart beats. The second ("bottom") number is called the diastolic pressure. It is a measure of the pressure in your arteries as the heart relaxes. What are the causes? The cause of this condition is not known. What increases the risk? Some risk factors for high blood pressure are under your control. Others are not. Factors you can change  Smoking.  Having type 2  diabetes mellitus, high cholesterol, or both.  Not getting enough exercise or physical activity.  Being overweight.  Having too much fat, sugar, calories, or salt (sodium) in your diet.  Drinking too much alcohol. Factors that are difficult or impossible to change  Having chronic kidney disease.  Having a family history of high blood pressure.  Age. Risk increases with age.  Race. You may be at higher risk if you are African-American.  Gender. Men are at higher risk than women before age 59. After age 60, women are at higher risk than men.  Having obstructive sleep apnea.  Stress. What are the signs or symptoms? Extremely high blood pressure (hypertensive crisis) may cause:  Headache.  Anxiety.  Shortness of breath.  Nosebleed.  Nausea and vomiting.  Severe chest pain.  Jerky movements you cannot control (seizures).  How is this diagnosed? This condition is diagnosed by measuring your blood pressure while you are seated, with your arm resting on a surface. The cuff of the blood pressure monitor will be placed directly against the skin of your upper arm at the level of your heart. It should be measured at least twice using the same arm. Certain conditions can cause a difference in blood pressure between your right and left arms. Certain factors can cause blood pressure readings to be lower or higher than normal (elevated) for  a short period of time:  When your blood pressure is higher when you are in a health care provider's office than when you are at home, this is called white coat hypertension. Most people with this condition do not need medicines.  When your blood pressure is higher at home than when you are in a health care provider's office, this is called masked hypertension. Most people with this condition may need medicines to control blood pressure.  If you have a high blood pressure reading during one visit or you have normal blood pressure with other risk  factors:  You may be asked to return on a different day to have your blood pressure checked again.  You may be asked to monitor your blood pressure at home for 1 week or longer.  If you are diagnosed with hypertension, you may have other blood or imaging tests to help your health care provider understand your overall risk for other conditions. How is this treated? This condition is treated by making healthy lifestyle changes, such as eating healthy foods, exercising more, and reducing your alcohol intake. Your health care provider may prescribe medicine if lifestyle changes are not enough to get your blood pressure under control, and if:  Your systolic blood pressure is above 130.  Your diastolic blood pressure is above 80.  Your personal target blood pressure may vary depending on your medical conditions, your age, and other factors. Follow these instructions at home: Eating and drinking  Eat a diet that is high in fiber and potassium, and low in sodium, added sugar, and fat. An example eating plan is called the DASH (Dietary Approaches to Stop Hypertension) diet. To eat this way: ? Eat plenty of fresh fruits and vegetables. Try to fill half of your plate at each meal with fruits and vegetables. ? Eat whole grains, such as whole wheat pasta, brown rice, or whole grain bread. Fill about one quarter of your plate with whole grains. ? Eat or drink low-fat dairy products, such as skim milk or low-fat yogurt. ? Avoid fatty cuts of meat, processed or cured meats, and poultry with skin. Fill about one quarter of your plate with lean proteins, such as fish, chicken without skin, beans, eggs, and tofu. ? Avoid premade and processed foods. These tend to be higher in sodium, added sugar, and fat.  Reduce your daily sodium intake. Most people with hypertension should eat less than 1,500 mg of sodium a day.  Limit alcohol intake to no more than 1 drink a day for nonpregnant women and 2 drinks a day  for men. One drink equals 12 oz of beer, 5 oz of wine, or 1 oz of hard liquor. Lifestyle  Work with your health care provider to maintain a healthy body weight or to lose weight. Ask what an ideal weight is for you.  Get at least 30 minutes of exercise that causes your heart to beat faster (aerobic exercise) most days of the week. Activities may include walking, swimming, or biking.  Include exercise to strengthen your muscles (resistance exercise), such as pilates or lifting weights, as part of your weekly exercise routine. Try to do these types of exercises for 30 minutes at least 3 days a week.  Do not use any products that contain nicotine or tobacco, such as cigarettes and e-cigarettes. If you need help quitting, ask your health care provider.  Monitor your blood pressure at home as told by your health care provider.  Keep all follow-up visits  as told by your health care provider. This is important. Medicines  Take over-the-counter and prescription medicines only as told by your health care provider. Follow directions carefully. Blood pressure medicines must be taken as prescribed.  Do not skip doses of blood pressure medicine. Doing this puts you at risk for problems and can make the medicine less effective.  Ask your health care provider about side effects or reactions to medicines that you should watch for. Contact a health care provider if:  You think you are having a reaction to a medicine you are taking.  You have headaches that keep coming back (recurring).  You feel dizzy.  You have swelling in your ankles.  You have trouble with your vision. Get help right away if:  You develop a severe headache or confusion.  You have unusual weakness or numbness.  You feel faint.  You have severe pain in your chest or abdomen.  You vomit repeatedly.  You have trouble breathing. Summary  Hypertension is when the force of blood pumping through your arteries is too strong.  If this condition is not controlled, it may put you at risk for serious complications.  Your personal target blood pressure may vary depending on your medical conditions, your age, and other factors. For most people, a normal blood pressure is less than 120/80.  Hypertension is treated with lifestyle changes, medicines, or a combination of both. Lifestyle changes include weight loss, eating a healthy, low-sodium diet, exercising more, and limiting alcohol. This information is not intended to replace advice given to you by your health care provider. Make sure you discuss any questions you have with your health care provider. Document Released: 01/15/2005 Document Revised: 12/14/2015 Document Reviewed: 12/14/2015 Elsevier Interactive Patient Education  2018 [employer].     IF you received an x-ray today, you will receive an invoice from [facility]. Please contact [facility] at [phone] with questions or concerns regarding your invoice.   IF you received labwork today, you will receive an invoice from [facility]. Please contact [facility] at [phone] with questions or concerns regarding your invoice.   Our billing staff will not be able to assist you with questions regarding bills from these companies.  You will be contacted with the lab results as soon as they are available. The fastest way to get your results is to activate your My Chart account. Instructions are located on the last page of this paperwork. If you have not heard from [place] regarding the results in 2 weeks, please contact this office.

## 2019-01-07 DIAGNOSIS — H11152 Pinguecula, left eye: Secondary | ICD-10-CM | POA: Diagnosis not present

## 2019-06-01 DIAGNOSIS — H0288B Meibomian gland dysfunction left eye, upper and lower eyelids: Secondary | ICD-10-CM | POA: Diagnosis not present

## 2019-06-01 DIAGNOSIS — H11823 Conjunctivochalasis, bilateral: Secondary | ICD-10-CM | POA: Diagnosis not present

## 2019-06-01 DIAGNOSIS — H401134 Primary open-angle glaucoma, bilateral, indeterminate stage: Secondary | ICD-10-CM | POA: Diagnosis not present

## 2019-06-01 DIAGNOSIS — H0288A Meibomian gland dysfunction right eye, upper and lower eyelids: Secondary | ICD-10-CM | POA: Diagnosis not present

## 2019-06-08 DIAGNOSIS — H04203 Unspecified epiphora, bilateral lacrimal glands: Secondary | ICD-10-CM | POA: Diagnosis not present

## 2019-06-08 DIAGNOSIS — H0288A Meibomian gland dysfunction right eye, upper and lower eyelids: Secondary | ICD-10-CM | POA: Diagnosis not present

## 2019-06-08 DIAGNOSIS — H401133 Primary open-angle glaucoma, bilateral, severe stage: Secondary | ICD-10-CM | POA: Diagnosis not present

## 2019-06-08 DIAGNOSIS — H11823 Conjunctivochalasis, bilateral: Secondary | ICD-10-CM | POA: Diagnosis not present

## 2019-09-17 DIAGNOSIS — H11823 Conjunctivochalasis, bilateral: Secondary | ICD-10-CM | POA: Diagnosis not present

## 2019-09-17 DIAGNOSIS — H04123 Dry eye syndrome of bilateral lacrimal glands: Secondary | ICD-10-CM | POA: Diagnosis not present

## 2019-09-17 DIAGNOSIS — H401133 Primary open-angle glaucoma, bilateral, severe stage: Secondary | ICD-10-CM | POA: Diagnosis not present

## 2019-09-17 DIAGNOSIS — H0288A Meibomian gland dysfunction right eye, upper and lower eyelids: Secondary | ICD-10-CM | POA: Diagnosis not present

## 2019-10-12 ENCOUNTER — Ambulatory Visit: Payer: Self-pay | Admitting: General Surgery

## 2019-10-12 DIAGNOSIS — D171 Benign lipomatous neoplasm of skin and subcutaneous tissue of trunk: Secondary | ICD-10-CM | POA: Diagnosis not present

## 2019-10-12 NOTE — H&P (Signed)
The patient is a 54 year old male who presents with a complaint of Mass. 54 year old male who presents to the office for evaluation of a chest wall mass. He states that this is been present for several years. He first brought to the attention of his doctor when it was about the size of a baseball. He states that over the last few years that has been growing in size. He denies any pain or difficulty with arm movements. Mass was seen on CT scan of the chest, approximately 3 years ago and measured to be 8.5 cm in diameter.   Diagnostic Studies History [facility] Teressa Senter, [place]; 10/12/2019 1:34 PM) Colonoscopy never  Allergies (Chanel Teressa Senter, CMA; 10/12/2019 1:35 PM) No Known Drug Allergies [10/12/2019]: Allergies Reconciled  Medication History (Chanel Teressa Senter, CMA; 10/12/2019 1:36 PM) NP Thyroid (90MG  Tablet, Oral) Active. amLODIPine Besylate (5MG  Tablet, Oral) Active. Polytrim (10000-0.1UNIT/ML-% Solution, Ophthalmic) Active. Medications Reconciled  Social History Antonietta Jewel, CMA; 10/12/2019 1:34 PM) Caffeine use Coffee. No alcohol use Tobacco use Never smoker.  Family History Antonietta Jewel, [place]; 10/12/2019 1:34 PM) First Degree Relatives No pertinent family history  Other Problems (Chanel Teressa Senter, [place]; 10/12/2019 1:34 PM) No pertinent past medical history     Review of Systems ([place]; 10/12/2019 1:34 PM) General Not Present- Appetite Loss, Chills, Fatigue, Fever, Night Sweats, Weight Gain and Weight Loss. Skin Not Present- Change in Wart/Mole, Dryness, Hives, Jaundice, New Lesions, Non-Healing Wounds, Rash and Ulcer. HEENT Not Present- Earache, Hearing Loss, Hoarseness, Nose Bleed, Oral Ulcers, Ringing in the Ears, Seasonal Allergies, Sinus Pain, Sore Throat, Visual Disturbances, Wears glasses/contact lenses and Yellow Eyes. Respiratory Not Present- Bloody sputum, Chronic Cough, Difficulty Breathing, Snoring and Wheezing. Breast Not Present- Breast Mass, Breast  Pain, Nipple Discharge and Skin Changes. Cardiovascular Not Present- Chest Pain, Difficulty Breathing Lying Down, Leg Cramps, Palpitations, Rapid Heart Rate, Shortness of Breath and Swelling of Extremities. Gastrointestinal Not Present- Abdominal Pain, Bloating, Bloody Stool, Change in Bowel Habits, Chronic diarrhea, Constipation, Difficulty Swallowing, Excessive gas, Gets full quickly at meals, Hemorrhoids, Indigestion, Nausea, Rectal Pain and Vomiting. Male Genitourinary Not Present- Blood in Urine, Change in Urinary Stream, Frequency, Impotence, Nocturia, Painful Urination, Urgency and Urine Leakage. Musculoskeletal Not Present- Back Pain, Joint Pain, Joint Stiffness, Muscle Pain, Muscle Weakness and Swelling of Extremities. Neurological Not Present- Decreased Memory, Fainting, Headaches, Numbness, Seizures, Tingling, Tremor, Trouble walking and Weakness. Psychiatric Not Present- Anxiety, Bipolar, Change in Sleep Pattern, Depression, Fearful and Frequent crying. Endocrine Not Present- Cold Intolerance, Excessive Hunger, Hair Changes, Heat Intolerance, Hot flashes and New Diabetes. Hematology Not Present- Blood Thinners, Easy Bruising, Excessive bleeding, Gland problems, HIV and Persistent Infections.  Vitals (Chanel Nolan CMA; 10/12/2019 1:36 PM) 10/12/2019 1:36 PM Weight: 197.13 lb Height: 71in Body Surface Area: 2.1 m Body Mass Index: 27.49 kg/m  Temp.: 97.76F  Pulse: 50 (Regular)     Physical Exam Leighton Ruff MD; 2/54/2706 1:46 PM)  General Mental Status-Alert. General Appearance-Depressed. CV: RRR Lungs: CTA Chest and Lung Exam Inspection Chest Wall - Inspection of the chest wall reveals - Note: Patient has what appears to be a subcutaneous mass that is mobile and soft in nature. It is approximately 15 cm in diameter and arising off of the left posterior lateral chest wall. It is nontender to palpation.    Assessment & Plan Leighton Ruff MD; 2/37/6283 1:44  PM)  LIPOMA OF BACK (D17.1) Impression: 54 year old male who presents to the office for evaluation of a left lateral chest wall mass which  has been present for several years. It is enlarging over time. I recommended excisional biopsy. We have discussed this in detail including risk of seroma and recurrence. I believe he understands this and wishes to proceed with surgery.

## 2019-11-10 ENCOUNTER — Ambulatory Visit: Payer: No Typology Code available for payment source | Admitting: Cardiology

## 2019-11-16 ENCOUNTER — Other Ambulatory Visit: Payer: Self-pay

## 2019-11-16 ENCOUNTER — Encounter: Payer: Self-pay | Admitting: Cardiology

## 2019-11-16 ENCOUNTER — Encounter: Payer: Self-pay | Admitting: *Deleted

## 2019-11-16 ENCOUNTER — Ambulatory Visit: Payer: No Typology Code available for payment source | Admitting: Cardiology

## 2019-11-16 VITALS — BP 140/90 | HR 44 | Ht 73.0 in | Wt 198.0 lb

## 2019-11-16 DIAGNOSIS — R001 Bradycardia, unspecified: Secondary | ICD-10-CM | POA: Diagnosis not present

## 2019-11-16 DIAGNOSIS — I10 Essential (primary) hypertension: Secondary | ICD-10-CM

## 2019-11-16 MED ORDER — LOSARTAN POTASSIUM 50 MG PO TABS: 50.0000 mg | ORAL_TABLET | Freq: Every day | ORAL | 3 refills | Status: DC

## 2019-11-16 NOTE — Progress Notes (Signed)
Patient ID: Steven Morales, male   DOB: 1965-02-10, 54 y.o.   MRN: [id number] Patient enrolled for [employer] to ship a 3 day ZIO XT long term holter monitor to his home.

## 2019-11-16 NOTE — Patient Instructions (Addendum)
Medication Instructions:  Your physician has recommended you make the following change in your medication:  1) STOP taking amlodipine (Norvasc) 2) START taking losartan (Cozaar) 50 mg daily   *If you need a refill on your cardiac medications before your next appointment, please call your pharmacy*  Lab Work: BMET and TSH in one week If you have labs (blood work) drawn today and your tests are completely normal, you will receive your results only by: Marland Kitchen MyChart Message (if you have MyChart) OR . A paper copy in the mail If you have any lab test that is abnormal or we need to change your treatment, we will call you to review the results.   Testing/Procedures: Your physician has recommended that you wear an event monitor. Event monitors are medical devices that record the heart's electrical activity. Doctors most often [place] these monitors to diagnose arrhythmias. Arrhythmias are problems with the speed or rhythm of the heartbeat. The monitor is a small, portable device. You can wear one while you do your normal daily activities. This is usually used to diagnose what is causing palpitations/syncope (passing out).  Your physician has recommended that you wear a blood pressure monitor  Follow-Up: At [facility], you and your health needs are our priority.  As part of our continuing mission to provide you with exceptional heart care, we have created designated Provider Care Teams.  These Care Teams include your primary Cardiologist (physician) and Advanced Practice Providers (APPs -  Physician Assistants and Nurse Practitioners) who all work together to provide you with the care you need, when you need it.  We recommend signing up for the patient portal called "MyChart".  Sign up information is provided on this After Visit Summary.  MyChart is used to connect with patients for Virtual Visits (Telemedicine).  Patients are able to view lab/test results, encounter notes, upcoming appointments, etc.   Non-urgent messages can be sent to your provider as well.   To learn more about what you can do with MyChart, go to [email or web address].    Follow-up with Dr. Radford Pax as needed based on results of testing.    Other Instructions ZIO XT- Long Term Monitor Instructions   Your physician has requested you wear your ZIO patch monitor_3_days.   This is a single patch monitor.  [employer] supplies one patch monitor per enrollment.  Additional stickers are not available.   Please do not apply patch if you will be having a Nuclear Stress Test, Echocardiogram, Cardiac CT, MRI, or Chest Xray during the time frame you would be wearing the monitor. The patch cannot be worn during these tests.  You cannot remove and re-apply the ZIO XT patch monitor.   Your ZIO patch monitor will be sent USPS Priority mail from [facility] directly to your home address. The monitor may also be mailed to a PO BOX if home delivery is not available.   It may take 3-5 days to receive your monitor after you have been enrolled.   Once you have received you monitor, please review enclosed instructions.  Your monitor has already been registered assigning a specific monitor serial # to you.   Applying the monitor   Shave hair from upper left chest.   Hold abrader disc by orange tab.  Rub abrader in 40 strokes over left upper chest as indicated in your monitor instructions.   Clean area with 4 enclosed alcohol pads .  Use all pads to assure are is cleaned thoroughly.  Let dry.  Apply patch as indicated in monitor instructions.  Patch will be place under collarbone on left side of chest with arrow pointing upward.   Rub patch adhesive wings for 2 minutes.Remove white label marked "1".  Remove white label marked "2".  Rub patch adhesive wings for 2 additional minutes.   While looking in a mirror, press and release button in center of patch.  A small green light will flash 3-4 times .  This will be your only indicator  the monitor has been turned on.     Do not shower for the first 24 hours.  You may shower after the first 24 hours.   Press button if you feel a symptom. You will hear a small click.  Record Date, Time and Symptom in the Patient Log Book.   When you are ready to remove patch, follow instructions on last 2 pages of Patient Log Book.  Stick patch monitor onto last page of Patient Log Book.   Place Patient Log Book in [place] box.  Use locking tab on box and tape box closed securely.  The [employer] and [employer] has [employer] on it.  Please place in mailbox as soon as possible.  Your physician should have your test results approximately 7 days after the monitor has been mailed back to [facility].   Call [place] at [phone] if you have questions regarding your ZIO XT patch monitor.  Call them immediately if you see an orange light blinking on your monitor.   If your monitor falls off in less than 4 days contact our Monitor department at [phone].  If your monitor becomes loose or falls off after 4 days call [employer] at [phone] for suggestions on securing your monitor.

## 2019-11-16 NOTE — Progress Notes (Signed)
Cardiology Consult Note    Date:  11/16/2019   ID:  Steven Morales, DOB 1965-06-24, MRN [id number]  PCP:  Horald Pollen, MD  Cardiologist:  Fransico Him, MD   Chief Complaint  Patient presents with   New Patient (Initial Visit)    Bradycardia    History of Present Illness:  Steven Morales is a 54 y.o. male who is being seen today for the evaluation of Bradycardia at the request of Irene Pap, NP.  This is a 54yo AAM with a hx of anemia, HLD, HTN, hypothyroidism.  He tells me that he has been told on multiple occasions that he has a slow heart rate.  He was recently seen by his PCP and noted to have HRs in the 50's.  He denies any chest pain or pressure, SOB, DOE, PND, orthopnea, LE edema.  He occasionally has some dizziness when standing up.  He has never had syncope.  He denies any weakness or fatigue.    Past Medical History:  Diagnosis Date   Acute pharyngitis    Allergy    Anemia    Bradycardia    Deficiency of other specified B group vitamins    Dry eye syndrome of both lacrimal glands    Elevated blood pressure reading without diagnosis of hypertension    Hyperlipidemia    Hypertension    Hypothyroidism    Memory loss    Vitamin A deficiency     Past Surgical History:  Procedure Laterality Date   BACK SURGERY      Current Medications: Current Meds  Medication Sig   amLODipine (NORVASC) 5 MG tablet Take 5 mg by mouth daily. Reported on 08/09/2015   cetirizine (ZYRTEC) 10 MG tablet    Cholecalciferol (VITAMIN D-3) 1000 UNITS CAPS Take 1 capsule by mouth daily. Reported on 08/09/2015   FLUTICASONE-SALMETEROL IN Inhale into the lungs as needed.    furosemide (LASIX) 40 MG tablet Take 40 mg by mouth as needed.    hydrochlorothiazide (HYDRODIURIL) 25 MG tablet    ibuprofen (ADVIL,MOTRIN) 600 MG tablet Take 1 tablet (600 mg total) by mouth every 6 (six) hours as needed.   levothyroxine (SYNTHROID, LEVOTHROID) 150 MCG tablet Take 1  tablet (150 mcg total) by mouth daily before breakfast.   metoprolol tartrate (LOPRESSOR) 25 MG tablet Reported on 08/09/2015   Olopatadine HCl 0.2 % SOLN    simvastatin (ZOCOR) 40 MG tablet     Allergies:   Patient has no known allergies.   Social History   Socioeconomic History   Marital status: Divorced    Spouse name: Not on file   Number of children: 6   Years of education: HS   Highest education level: Not on file  Occupational History    Employer: [employer]    Comment: [place] [employer]  Tobacco Use   Smoking status: Never Smoker   Smokeless tobacco: Never Used  Substance and Sexual Activity   Alcohol use: No   Drug use: No   Sexual activity: Not on file  Other Topics Concern   Not on file  Social History Narrative   Patient lives at home with his spouse.   Caffeine Use: 2-3 cups daily   Social Determinants of Health   Financial Resource Strain:    Difficulty of Paying Living Expenses: Not on file  Food Insecurity:    Worried About [employer] in the Last Year: Not on file   [employer] of Food in  the Last Year: Not on file  Transportation Needs:    Lack of Transportation (Medical): Not on file   Lack of Transportation (Non-Medical): Not on file  Physical Activity:    Days of Exercise per Week: Not on file   Minutes of Exercise per Session: Not on file  Stress:    Feeling of Stress : Not on file  Social Connections:    Frequency of Communication with Friends and Family: Not on file   Frequency of Social Gatherings with Friends and Family: Not on file   Attends Religious Services: Not on file   Active Member of Clubs or Organizations: Not on file   Attends Archivist Meetings: Not on file   Marital Status: Not on file     Family History:  The patient's family history includes Diabetes in his father; Hypertension in his father; Thyroid disease in his father.   ROS:   Please see the history of present  illness.    ROS All other systems reviewed and are negative.  No flowsheet data found.     PHYSICAL EXAM:   VS:  BP 140/90    Pulse (!) 44    Ht 6\' 1"  (1.854 m)    Wt 198 lb (89.8 kg)    SpO2 98%    BMI 26.12 kg/m    GEN: Well nourished, well developed, in no acute distress  HEENT: normal  Neck: no JVD, carotid bruits, or masses Cardiac: RRR; no murmurs, rubs, or gallops,no edema.  Intact distal pulses bilaterally.  Respiratory:  clear to auscultation bilaterally, normal work of breathing GI: soft, nontender, nondistended, + BS MS: no deformity or atrophy  Skin: warm and dry, no rash Neuro:  Alert and Oriented x 3, Strength and sensation are intact Psych: euthymic mood, full affect  Wt Readings from Last 3 Encounters:  11/16/19 198 lb (89.8 kg)  10/31/15 180 lb (81.6 kg)  08/09/15 181 lb (82.1 kg)      Studies/Labs Reviewed:   EKG:  EKG is ordered today.  The ekg ordered today demonstrates sinus bradycardia at 44bpm with nonspecific T wave abnormality  Recent Labs: No results found for requested labs within last 8760 hours.   Lipid Panel    Component Value Date/Time   CHOL 136 08/09/2015 0906   TRIG 38 08/09/2015 0906   HDL 64 08/09/2015 0906   CHOLHDL 2.1 08/09/2015 0906   VLDL 8 08/09/2015 0906   LDLCALC 64 08/09/2015 0906    Additional studies/ records that were reviewed today include:  OV notes from PCP and EKG    ASSESSMENT:    1. Bradycardia   2. Essential hypertension      PLAN:  In order of problems listed above:  1. Bradycardia -he is asymptomatic -he has metoprolol on his list but we called the pharmacy and they said he is not taking it -his HR is in the 40's at rest -I will get a 3 day ziopatch to assess for average heart rate -check TSH -stop amlodipine  2.  HTN -Bp borderline controlled -continue HCTZ 25mg  daily -due to bradycardia will stop amlodipine and start on Losartan 50mg  daily -check Bp daily for a week and call with  results -check BMET in 1 week  PRN followup  Medication Adjustments/Labs and Tests Ordered: Current medicines are reviewed at length with the patient today.  Concerns regarding medicines are outlined above.  Medication changes, Labs and Tests ordered today are listed in the Patient Instructions below.  There are no Patient Instructions on file for this visit.   Signed, Fransico Him, MD  11/16/2019 9:34 AM    [address], [place], [place]  [address] Phone: [phone]; Fax: [phone]

## 2019-11-16 NOTE — Addendum Note (Signed)
Addended by: Antonieta Iba on: 11/16/2019 09:44 AM   Modules accepted: Orders

## 2019-11-23 ENCOUNTER — Ambulatory Visit (INDEPENDENT_AMBULATORY_CARE_PROVIDER_SITE_OTHER): Payer: BC Managed Care – PPO

## 2019-11-23 ENCOUNTER — Other Ambulatory Visit: Payer: Self-pay

## 2019-11-23 DIAGNOSIS — R001 Bradycardia, unspecified: Secondary | ICD-10-CM

## 2019-11-23 DIAGNOSIS — I10 Essential (primary) hypertension: Secondary | ICD-10-CM

## 2019-11-24 ENCOUNTER — Other Ambulatory Visit: Payer: BC Managed Care – PPO | Admitting: *Deleted

## 2019-11-24 DIAGNOSIS — R001 Bradycardia, unspecified: Secondary | ICD-10-CM | POA: Diagnosis not present

## 2019-11-24 DIAGNOSIS — I10 Essential (primary) hypertension: Secondary | ICD-10-CM | POA: Diagnosis not present

## 2019-11-24 LAB — BASIC METABOLIC PANEL
BUN/Creatinine Ratio: 15 (ref 9–20)
BUN: 16 mg/dL (ref 6–24)
CO2: 27 mmol/L (ref 20–29)
Calcium: 9.6 mg/dL (ref 8.7–10.2)
Chloride: 102 mmol/L (ref 96–106)
Creatinine, Ser: 1.09 mg/dL (ref 0.76–1.27)
GFR calc Af Amer: 88 mL/min/{1.73_m2} (ref >59)
GFR calc non Af Amer: 76 mL/min/{1.73_m2} (ref >59)
Glucose: 95 mg/dL (ref 65–99)
Potassium: 4.1 mmol/L (ref 3.5–5.2)
Sodium: 142 mmol/L (ref 134–144)

## 2019-11-24 LAB — TSH: TSH: 55.5 u[IU]/mL — ABNORMAL HIGH (ref 0.450–4.500)

## 2019-11-25 ENCOUNTER — Telehealth: Payer: Self-pay | Admitting: Emergency Medicine

## 2019-11-25 NOTE — Telephone Encounter (Signed)
11/25/2019 -  I RECEIVED A CALL FROM PAT (TRIAGE NURSE) AT [facility]. DR. Tressia Miners TURNER (CARDIOLOGIST) WOULD LIKE DR. MIGUEL TO REVIEW PATIENT'S LABS DONE ON 11/24/2019. HER NOTES ARE IN Epic UNDER THE LABS TAB. THIS PATIENT'S TSH IS SEVERELY ELEVATED LIKELY DUE TO HIS BRADYCARDIA AND NEEDS TO BE ADDRESSED. (DR. MIGUEL -  PATIENT HAS AN OFFICE VISIT SCHEDULED WITH YOU ON Thursday (11/26/2019) AT 2:00 pm). BEST PHONE IF QUESTIONS: [phone] ([place]) [place]

## 2019-11-26 ENCOUNTER — Encounter: Payer: Self-pay | Admitting: Emergency Medicine

## 2019-11-26 ENCOUNTER — Other Ambulatory Visit: Payer: Self-pay

## 2019-11-26 ENCOUNTER — Ambulatory Visit: Payer: BLUE CROSS/BLUE SHIELD | Admitting: Emergency Medicine

## 2019-11-26 VITALS — BP 145/67 | HR 57 | Temp 97.3°F | Resp 16 | Ht 72.0 in | Wt 200.0 lb

## 2019-11-26 DIAGNOSIS — E890 Postprocedural hypothyroidism: Secondary | ICD-10-CM

## 2019-11-26 DIAGNOSIS — I10 Essential (primary) hypertension: Secondary | ICD-10-CM | POA: Diagnosis not present

## 2019-11-26 DIAGNOSIS — Z1211 Encounter for screening for malignant neoplasm of colon: Secondary | ICD-10-CM

## 2019-11-26 NOTE — Patient Instructions (Addendum)
If you have lab work done today you will be contacted with your lab results within the next 2 weeks.  If you have not heard from [place] then please contact us. The fastest way to get your results is to register for My Chart.   IF you received an x-ray today, you will receive an invoice from [facility]. Please contact [facility] at [phone] with questions or concerns regarding your invoice.   IF you received labwork today, you will receive an invoice from [facility]. Please contact [facility] at [phone] with questions or concerns regarding your invoice.   Our billing staff will not be able to assist you with questions regarding bills from these companies.  You will be contacted with the lab results as soon as they are available. The fastest way to get your results is to activate your My Chart account. Instructions are located on the last page of this paperwork. If you have not heard from [place] regarding the results in 2 weeks, please contact this office.    Hypothyroidism Hypothyroidism  Hypothyroidism is when the thyroid gland does not make enough of certain hormones (it is underactive). The thyroid gland is a small gland located in the lower front part of the neck, just in front of the windpipe (trachea). This gland makes hormones that help control how the body uses food for energy (metabolism) as well as how the heart and brain function. These hormones also play a role in keeping your bones strong. When the thyroid is underactive, it produces too little of the hormones thyroxine (T4) and triiodothyronine (T3). What are the causes? This condition may be caused by:  Hashimoto's disease. This is a disease in which the body's disease-fighting system (immune system) attacks the thyroid gland. This is the most common cause.  Viral infections.  Pregnancy.  Certain medicines.  Birth defects.  Past radiation treatments to the head or neck for cancer.  Past treatment  with radioactive iodine.  Past exposure to radiation in the environment.  Past surgical removal of part or all of the thyroid.  Problems with a gland in the center of the brain (pituitary gland).  Lack of enough iodine in the diet. What increases the risk? You are more likely to develop this condition if:  You are male.  You have a family history of thyroid conditions.  You use a medicine called lithium.  You take medicines that affect the immune system (immunosuppressants). What are the signs or symptoms? Symptoms of this condition include:  Feeling as though you have no energy (lethargy).  Not being able to tolerate cold.  Weight gain that is not explained by a change in diet or exercise habits.  Lack of appetite.  Dry skin.  Coarse hair.  Menstrual irregularity.  Slowing of thought processes.  Constipation.  Sadness or depression. How is this diagnosed? This condition may be diagnosed based on:  Your symptoms, your medical history, and a physical exam.  Blood tests. You may also have imaging tests, such as an ultrasound or MRI. How is this treated? This condition is treated with medicine that replaces the thyroid hormones that your body does not make. After you begin treatment, it may take several weeks for symptoms to go away. Follow these instructions at home:  Take over-the-counter and prescription medicines only as told by your health care provider.  If you start taking any new medicines, tell your health care provider.  Keep all follow-up visits as told by your  health care provider. This is important. ? As your condition improves, your dosage of thyroid hormone medicine may change. ? You will need to have blood tests regularly so that your health care provider can monitor your condition. Contact a health care provider if:  Your symptoms do not get better with treatment.  You are taking thyroid replacement medicine and you: ? Sweat a  lot. ? Have tremors. ? Feel anxious. ? Lose weight rapidly. ? Cannot tolerate heat. ? Have emotional swings. ? Have diarrhea. ? Feel weak. Get help right away if you have:  Chest pain.  An irregular heartbeat.  A rapid heartbeat.  Difficulty breathing. Summary  Hypothyroidism is when the thyroid gland does not make enough of certain hormones (it is underactive).  When the thyroid is underactive, it produces too little of the hormones thyroxine (T4) and triiodothyronine (T3).  The most common cause is Hashimoto's disease, a disease in which the body's disease-fighting system (immune system) attacks the thyroid gland. The condition can also be caused by viral infections, medicine, pregnancy, or past radiation treatment to the head or neck.  Symptoms may include weight gain, dry skin, constipation, feeling as though you do not have energy, and not being able to tolerate cold.  This condition is treated with medicine to replace the thyroid hormones that your body does not make. This information is not intended to replace advice given to you by your health care provider. Make sure you discuss any questions you have with your health care provider. Document Revised: 12/28/2016 Document Reviewed: 12/26/2016 Elsevier Patient Education  2020 [employer].

## 2019-11-26 NOTE — Assessment & Plan Note (Signed)
Very elevated TSH level.  Patient has been taking medication erratically. Advised the need to take Synthroid 150 mcg daily.

## 2019-11-26 NOTE — Telephone Encounter (Signed)
Thanks, very aware of this.

## 2019-11-26 NOTE — Progress Notes (Signed)
Steven Morales 54 y.o.   Chief Complaint  Patient presents with  . Bradycardia    patient saw cardiology 11/23/2019    HISTORY OF PRESENT ILLNESS: This is a 54 y.o. male with history of hypothyroidism.  Seen by cardiologist 11/16/2019 for bradycardia.  Hypertensive just recently started on losartan 50 mg daily.  Zio patch applied for 3 days as per instructions.  Blood work shows increase TSH of 55.  Patient states he has been erratic taking levothyroxine. No other complaints or medical concerns today.  HPI   Prior to Admission medications   Medication Sig Start Date End Date Taking? Authorizing Provider  cetirizine (ZYRTEC) 10 MG tablet  10/03/15   [provider]  Cholecalciferol (VITAMIN D-3) 1000 UNITS CAPS Take 1 capsule by mouth daily. Reported on 08/09/2015    [provider]  FLUTICASONE-SALMETEROL IN Inhale into the lungs as needed.     [provider]  furosemide (LASIX) 40 MG tablet Take 40 mg by mouth as needed.  10/03/15   [provider]  hydrochlorothiazide (HYDRODIURIL) 25 MG tablet  10/03/15   [provider]  ibuprofen (ADVIL,MOTRIN) 600 MG tablet Take 1 tablet (600 mg total) by mouth every 6 (six) hours as needed. 08/17/16   Fatima Blank, MD  levothyroxine (SYNTHROID, LEVOTHROID) 150 MCG tablet Take 1 tablet (150 mcg total) by mouth daily before breakfast. 08/10/15   Everlene Farrier Loura Back, MD  losartan (COZAAR) 50 MG tablet Take 1 tablet (50 mg total) by mouth daily. 11/16/19   Sueanne Margarita, MD  Olopatadine HCl 0.2 % SOLN  10/29/15   [provider]  simvastatin (ZOCOR) 40 MG tablet  10/29/15   [provider]    No Known Allergies  Patient Active Problem List   Diagnosis Date Noted  . Essential hypertension 07/29/2017  . Muscle pain 02/27/2017  . 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 Bilateral arm pain 02/27/2017  . Degenerative disc disease, lumbar 10/31/2015  . Bradycardia 10/31/2015  . Pain in right elbow  11/29/2014  . Hypothyroidism following radioiodine therapy 06/24/2014    Past Medical History:  Diagnosis Date  . Acute pharyngitis   . Allergy   . Anemia   . Bradycardia   . 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 Dry eye syndrome of both lacrimal glands   . Elevated blood pressure reading without diagnosis of hypertension   . Hyperlipidemia   . Hypertension   . Hypothyroidism   . Memory loss   . Vitamin A deficiency     Past Surgical History:  Procedure Laterality Date  . BACK SURGERY      Social History   Socioeconomic History  . Marital status: Divorced    Spouse name: Not on file  . Number of children: 6  . Years of education: HS  . Highest education level: Not on file  Occupational History    Employer: [employer]    Comment: [place] [employer]  Tobacco Use  . Smoking status: Never Smoker  . Smokeless tobacco: Never Used  Substance and Sexual Activity  . Alcohol use: No  . Drug use: No  . Sexual activity: Not on file  Other Topics Concern  . Not on file  Social History Narrative   Patient lives at home with his spouse.   Caffeine Use: 2-3 cups daily   Social Determinants of Health   Financial Resource Strain:   . Difficulty of Paying Living Expenses: Not on file  Food Insecurity:   . Worried About Char[employer] fundraiser in the Last Year: Not on file  . Ran Out of Food in the Last Year: Not on file  Transportation Needs:   . Lack of Transportation (Medical): Not on file  . Lack of Transportation (Non-Medical): Not on file  Physical Activity:   . Days of Exercise per Week: Not on file  . Minutes of Exercise per Session: Not on file  Stress:   . Feeling of Stress : Not on file  Social Connections:   . Frequency of Communication with Friends and Family: Not on file  . Frequency of Social Gatherings with Friends and Family: Not on file  . Attends Religious Services: Not on file  . Active Member of Clubs or Organizations: Not on file  .  Attends Archivist Meetings: Not on file  . Marital Status: Not on file  Intimate Partner Violence:   . Fear of Current or Ex-Partner: Not on file  . Emotionally Abused: Not on file  . Physically Abused: Not on file  . Sexually Abused: Not on file    Family History  Problem Relation Age of Onset  . Hypertension Father   . Thyroid disease Father   . Diabetes Father      Review of Systems  Constitutional: Negative.  Negative for chills and fever.  HENT: Negative.  Negative for congestion and sore throat.   Respiratory: Negative.  Negative for cough and shortness of breath.   Cardiovascular: Negative.  Negative for chest pain, palpitations and leg swelling.  Gastrointestinal: Negative.  Negative for abdominal pain, diarrhea, nausea and vomiting.  Genitourinary: Negative.  Negative for dysuria and hematuria.  Musculoskeletal: Negative.  Negative for back pain, myalgias and neck pain.  Skin: Negative.  Negative for rash.  Neurological: Negative.  Negative for dizziness and headaches.  Endo/Heme/Allergies: Negative.   All other systems reviewed and are negative.   Today's Vitals   11/26/19 1405  BP: (!) 145/67  Pulse: (!) 57  Resp: 16  Temp: (!) 97.3 F (36.3 C)  TempSrc: Temporal  SpO2: 98%  Weight: 200 lb (90.7 kg)  Height: 6' (1.829 m)   Body mass index is 27.12 kg/m.  Physical Exam Vitals reviewed.  Constitutional:      Appearance: Normal appearance.  HENT:     Head: Normocephalic.  Eyes:     Extraocular Movements: Extraocular movements intact.     Pupils: Pupils are equal, round, and reactive to light.  Cardiovascular:     Rate and Rhythm: Normal rate and regular rhythm.     Pulses: Normal pulses.     Heart sounds: Normal heart sounds.  Pulmonary:     Effort: Pulmonary effort is normal.     Breath sounds: Normal breath sounds.  Musculoskeletal:        General: Normal range of motion.     Cervical back: Normal range of motion and neck supple. No  tenderness.     Right lower leg: No edema.     Left lower leg: No edema.  Skin:    General: Skin is warm and dry.     Capillary Refill: Capillary refill takes less than 2 seconds.  Neurological:     General: No focal deficit present.     Mental Status: He is alert and oriented to person, place, and time.  Psychiatric:        Mood and Affect: Mood normal.        Behavior: Behavior  normal.      ASSESSMENT & PLAN: Steven Morales was seen today for bradycardia.  Diagnoses and all orders for this visit:  Hypothyroidism following radioiodine therapy  Essential hypertension  Screening for colon cancer -     Ambulatory referral to Gastroenterology    Patient Instructions       If you have lab work done today you will be contacted with your lab results within the next 2 weeks.  If you have not heard from [place] then please contact us. The fastest way to get your results is to register for My Chart.   IF you received an x-ray today, you will receive an invoice from [facility] Radiology. Please contact [facility] Radiology at [phone] with questions or concerns regarding your invoice.   IF you received labwork today, you will receive an invoice from [place]. Please contact [facility] at [phone] with questions or concerns regarding your invoice.   Our billing staff will not be able to assist you with questions regarding bills from these companies.  You will be contacted with the lab results as soon as they are available. The fastest way to get your results is to activate your My Chart account. Instructions are located on the last page of this paperwork. If you have not heard from [place] regarding the results in 2 weeks, please contact this office.    Hypothyroidism Hypothyroidism  Hypothyroidism is when the thyroid gland does not make enough of certain hormones (it is underactive). The thyroid gland is a small gland located in the lower front part of the neck, just in front of the  windpipe (trachea). This gland makes hormones that help control how the body uses food for energy (metabolism) as well as how the heart and brain function. These hormones also play a role in keeping your bones strong. When the thyroid is underactive, it produces too little of the hormones thyroxine (T4) and triiodothyronine (T3). What are the causes? This condition may be caused by:  Hashimoto's disease. This is a disease in which the body's disease-fighting system (immune system) attacks the thyroid gland. This is the most common cause.  Viral infections.  Pregnancy.  Certain medicines.  Birth defects.  Past radiation treatments to the head or neck for cancer.  Past treatment with radioactive iodine.  Past exposure to radiation in the environment.  Past surgical removal of part or all of the thyroid.  Problems with a gland in the center of the brain (pituitary gland).  Lack of enough iodine in the diet. What increases the risk? You are more likely to develop this condition if:  You are male.  You have a family history of thyroid conditions.  You use a medicine called lithium.  You take medicines that affect the immune system (immunosuppressants). What are the signs or symptoms? Symptoms of this condition include:  Feeling as though you have no energy (lethargy).  Not being able to tolerate cold.  Weight gain that is not explained by a change in diet or exercise habits.  Lack of appetite.  Dry skin.  Coarse hair.  Menstrual irregularity.  Slowing of thought processes.  Constipation.  Sadness or depression. How is this diagnosed? This condition may be diagnosed based on:  Your symptoms, your medical history, and a physical exam.  Blood tests. You may also have imaging tests, such as an ultrasound or MRI. How is this treated? This condition is treated with medicine that replaces the thyroid hormones that your body does not make. After you  begin  treatment, it may take several weeks for symptoms to go away. Follow these instructions at home:  Take over-the-counter and prescription medicines only as told by your health care provider.  If you start taking any new medicines, tell your health care provider.  Keep all follow-up visits as told by your health care provider. This is important. ? As your condition improves, your dosage of thyroid hormone medicine may change. ? You will need to have blood tests regularly so that your health care provider can monitor your condition. Contact a health care provider if:  Your symptoms do not get better with treatment.  You are taking thyroid replacement medicine and you: ? Sweat a lot. ? Have tremors. ? Feel anxious. ? Lose weight rapidly. ? Cannot tolerate heat. ? Have emotional swings. ? Have diarrhea. ? Feel weak. Get help right away if you have:  Chest pain.  An irregular heartbeat.  A rapid heartbeat.  Difficulty breathing. Summary  Hypothyroidism is when the thyroid gland does not make enough of certain hormones (it is underactive).  When the thyroid is underactive, it produces too little of the hormones thyroxine (T4) and triiodothyronine (T3).  The most common cause is Hashimoto's disease, a disease in which the body's disease-fighting system (immune system) attacks the thyroid gland. The condition can also be caused by viral infections, medicine, pregnancy, or past radiation treatment to the head or neck.  Symptoms may include weight gain, dry skin, constipation, feeling as though you do not have energy, and not being able to tolerate cold.  This condition is treated with medicine to replace the thyroid hormones that your body does not make. This information is not intended to replace advice given to you by your health care provider. Make sure you discuss any questions you have with your health care provider. Document Revised: 12/28/2016 Document Reviewed:  12/26/2016 Elsevier Patient Education  2020 [employer] Inc[employer]      Agustina Caroli, MD [facility]

## 2019-11-26 NOTE — Telephone Encounter (Signed)
FYI for 2 pm pt today

## 2019-12-01 ENCOUNTER — Telehealth: Payer: Self-pay

## 2019-12-01 DIAGNOSIS — I10 Essential (primary) hypertension: Secondary | ICD-10-CM

## 2019-12-01 MED ORDER — LOSARTAN POTASSIUM 100 MG PO TABS: 100.0000 mg | ORAL_TABLET | Freq: Every day | ORAL | 3 refills | Status: DC

## 2019-12-01 NOTE — Telephone Encounter (Signed)
The patient has been notified of the result and verbalized understanding.  All questions (if any) were answered. Antonieta Iba, RN 12/01/2019 4:46 PM  Rx has been sent in. BMET scheduled for 11/10. BP monitor has been ordered.

## 2019-12-01 NOTE — Telephone Encounter (Signed)
-----   Message from Sueanne Margarita, MD sent at 11/26/2019  2:41 PM EDT ----- BP still not controlled - increase Losartan to 100mg  daily and repeat BMET in 1 week and get a 24 hour BP monitor in 1 week

## 2019-12-02 DIAGNOSIS — R001 Bradycardia, unspecified: Secondary | ICD-10-CM | POA: Diagnosis not present

## 2019-12-07 ENCOUNTER — Ambulatory Visit (INDEPENDENT_AMBULATORY_CARE_PROVIDER_SITE_OTHER): Payer: BC Managed Care – PPO

## 2019-12-07 ENCOUNTER — Other Ambulatory Visit: Payer: Self-pay

## 2019-12-07 DIAGNOSIS — I10 Essential (primary) hypertension: Secondary | ICD-10-CM | POA: Diagnosis not present

## 2019-12-09 ENCOUNTER — Other Ambulatory Visit: Payer: BC Managed Care – PPO

## 2019-12-09 ENCOUNTER — Other Ambulatory Visit: Payer: Self-pay

## 2019-12-09 DIAGNOSIS — I10 Essential (primary) hypertension: Secondary | ICD-10-CM | POA: Diagnosis not present

## 2019-12-09 LAB — BASIC METABOLIC PANEL
BUN/Creatinine Ratio: 9 (ref 9–20)
BUN: 10 mg/dL (ref 6–24)
CO2: 28 mmol/L (ref 20–29)
Calcium: 9.3 mg/dL (ref 8.7–10.2)
Chloride: 103 mmol/L (ref 96–106)
Creatinine, Ser: 1.14 mg/dL (ref 0.76–1.27)
GFR calc Af Amer: 83 mL/min/{1.73_m2} (ref >59)
GFR calc non Af Amer: 72 mL/min/{1.73_m2} (ref >59)
Glucose: 101 mg/dL — ABNORMAL HIGH (ref 65–99)
Potassium: 4.2 mmol/L (ref 3.5–5.2)
Sodium: 137 mmol/L (ref 134–144)

## 2019-12-17 ENCOUNTER — Telehealth: Payer: Self-pay

## 2019-12-17 MED ORDER — LOSARTAN POTASSIUM 100 MG PO TABS
100.0000 mg | ORAL_TABLET | Freq: Every day | ORAL | 3 refills | Status: DC
Start: 2019-12-17 — End: 2020-02-17

## 2019-12-17 NOTE — Telephone Encounter (Signed)
-----   Message from Sueanne Margarita, MD sent at 12/10/2019 10:20 PM EST ----- BP remains high despite increasing losartan - please verify meds patient is taking and find out if he has missed any doses

## 2019-12-17 NOTE — Telephone Encounter (Signed)
Spoke with the patient who states that he is unsure what dose of losartan he has been taking. He is not home but states that he is taking the dose that he last picked up a few weeks ago from [facility].   Recently his dose was increased to losartan 100 mg daily. This RX was sent to a different pharmacy.   I called [facility] and they confirmed that the patient last picked up a prescription for losartan 50 mg daily on 11/16/19.   Called the patient back and advised him that he needs to start taking losartan 100 mg daily. I have sent in a new preacription to the patients preferred [facility] on [address].

## 2019-12-18 ENCOUNTER — Telehealth: Payer: Self-pay

## 2019-12-18 DIAGNOSIS — I10 Essential (primary) hypertension: Secondary | ICD-10-CM

## 2019-12-18 NOTE — Telephone Encounter (Signed)
-----   Message from Sueanne Margarita, MD sent at 12/18/2019  9:43 AM EST ----- Repeat 48 hour BP monitor after he starts taking the higher dose of Losartan  Traci ----- Message ----- From: Antonieta Iba, RN Sent: 12/17/2019   2:09 PM EST To: Sueanne Margarita, MD  Spoke with the patient and he has only been taking losartan 50 mg. He did not increase his dose to 100 mg, but states that he will start taking 100 mg daily tomorrow.

## 2019-12-18 NOTE — Telephone Encounter (Signed)
Spoke with the patient and he confirms that he has increased his losartan to 100 mg daily. Patient is aware that we will repeat BP monitor.

## 2020-01-11 ENCOUNTER — Encounter: Payer: Self-pay | Admitting: Emergency Medicine

## 2020-01-11 ENCOUNTER — Telehealth: Payer: Self-pay | Admitting: Emergency Medicine

## 2020-01-11 ENCOUNTER — Ambulatory Visit: Payer: BC Managed Care – PPO | Admitting: Emergency Medicine

## 2020-01-11 ENCOUNTER — Other Ambulatory Visit: Payer: Self-pay

## 2020-01-11 VITALS — BP 135/82 | HR 41 | Temp 98.3°F | Resp 16 | Ht 72.0 in | Wt 199.0 lb

## 2020-01-11 DIAGNOSIS — I10 Essential (primary) hypertension: Secondary | ICD-10-CM

## 2020-01-11 DIAGNOSIS — E890 Postprocedural hypothyroidism: Secondary | ICD-10-CM | POA: Diagnosis not present

## 2020-01-11 DIAGNOSIS — L293 Anogenital pruritus, unspecified: Secondary | ICD-10-CM | POA: Diagnosis not present

## 2020-01-11 DIAGNOSIS — R399 Unspecified symptoms and signs involving the genitourinary system: Secondary | ICD-10-CM

## 2020-01-11 DIAGNOSIS — N529 Male erectile dysfunction, unspecified: Secondary | ICD-10-CM

## 2020-01-11 DIAGNOSIS — R350 Frequency of micturition: Secondary | ICD-10-CM

## 2020-01-11 DIAGNOSIS — R001 Bradycardia, unspecified: Secondary | ICD-10-CM

## 2020-01-11 DIAGNOSIS — D171 Benign lipomatous neoplasm of skin and subcutaneous tissue of trunk: Secondary | ICD-10-CM

## 2020-01-11 DIAGNOSIS — M549 Dorsalgia, unspecified: Secondary | ICD-10-CM

## 2020-01-11 LAB — POCT GLYCOSYLATED HEMOGLOBIN (HGB A1C): Hemoglobin A1C: 5.1 % (ref 4.0–5.6)

## 2020-01-11 LAB — POCT URINALYSIS DIP (MANUAL ENTRY)
Bilirubin, UA: NEGATIVE
Blood, UA: NEGATIVE
Glucose, UA: NEGATIVE mg/dL
Ketones, POC UA: NEGATIVE mg/dL
Leukocytes, UA: NEGATIVE
Nitrite, UA: NEGATIVE
Protein Ur, POC: NEGATIVE mg/dL
Spec Grav, UA: 1.025 (ref 1.010–1.025)
Urobilinogen, UA: 0.2 E.U./dL
pH, UA: 6 (ref 5.0–8.0)

## 2020-01-11 LAB — GLUCOSE, POCT (MANUAL RESULT ENTRY): POC Glucose: 87 mg/dl (ref 70–99)

## 2020-01-11 MED ORDER — CLOTRIMAZOLE-BETAMETHASONE 1-0.05 % EX CREA
1.0000 | TOPICAL_CREAM | Freq: Two times a day (BID) | CUTANEOUS | 1 refills | Status: DC
Start: 2020-01-11 — End: 2020-02-11

## 2020-01-11 MED ORDER — SILDENAFIL CITRATE 100 MG PO TABS: 50.0000 mg | ORAL_TABLET | Freq: Every day | ORAL | 11 refills | Status: DC | PRN

## 2020-01-11 NOTE — Patient Instructions (Addendum)
   If you have lab work done today you will be contacted with your lab results within the next 2 weeks.  If you have not heard from us then please contact us. The fastest way to get your results is to register for My Chart.   IF you received an x-ray today, you will receive an invoice from [facility]. Please contact [facility] at [phone] with questions or concerns regarding your invoice.   IF you received labwork today, you will receive an invoice from [facility]. Please contact [facility] at [phone] with questions or concerns regarding your invoice.   Our billing staff will not be able to assist you with questions regarding bills from these companies.  You will be contacted with the lab results as soon as they are available. The fastest way to get your results is to activate your My Chart account. Instructions are located on the last page of this paperwork. If you have not heard from us regarding the results in 2 weeks, please contact this office.      Health Maintenance, Male Adopting a healthy lifestyle and getting preventive care are important in promoting health and wellness. Ask your health care provider about:  The right schedule for you to have regular tests and exams.  Things you can do on your own to prevent diseases and keep yourself healthy. What should I know about diet, weight, and exercise? Eat a healthy diet   Eat a diet that includes plenty of vegetables, fruits, low-fat dairy products, and lean protein.  Do not eat a lot of foods that are high in solid fats, added sugars, or sodium. Maintain a healthy weight Body mass index (BMI) is a measurement that can be used to identify possible weight problems. It estimates body fat based on height and weight. Your health care provider can help determine your BMI and help you achieve or maintain a healthy weight. Get regular exercise Get regular exercise. This is one of the most important things you  can do for your health. Most adults should:  Exercise for at least 150 minutes each week. The exercise should increase your heart rate and make you sweat (moderate-intensity exercise).  Do strengthening exercises at least twice a week. This is in addition to the moderate-intensity exercise.  Spend less time sitting. Even light physical activity can be beneficial. Watch cholesterol and blood lipids Have your blood tested for lipids and cholesterol at 55 years of age, then have this test every 5 years. You may need to have your cholesterol levels checked more often if:  Your lipid or cholesterol levels are high.  You are older than 55 years of age.  You are at high risk for heart disease. What should I know about cancer screening? Many types of cancers can be detected early and may often be prevented. Depending on your health history and family history, you may need to have cancer screening at various ages. This may include screening for:  Colorectal cancer.  Prostate cancer.  Skin cancer.  Lung cancer. What should I know about heart disease, diabetes, and high blood pressure? Blood pressure and heart disease  High blood pressure causes heart disease and increases the risk of stroke. This is more likely to develop in people who have high blood pressure readings, are of African descent, or are overweight.  Talk with your health care provider about your target blood pressure readings.  Have your blood pressure checked: ? Every 3-5 years if you are 18-39   years of age. ? Every year if you are 40 years old or older.  If you are between the ages of 65 and 75 and are a current or former smoker, ask your health care provider if you should have a one-time screening for abdominal aortic aneurysm (AAA). Diabetes Have regular diabetes screenings. This checks your fasting blood sugar level. Have the screening done:  Once every three years after age 45 if you are at a normal weight and have  a low risk for diabetes.  More often and at a younger age if you are overweight or have a high risk for diabetes. What should I know about preventing infection? Hepatitis B If you have a higher risk for hepatitis B, you should be screened for this virus. Talk with your health care provider to find out if you are at risk for hepatitis B infection. Hepatitis C Blood testing is recommended for:  Everyone born from 1945 through 1965.  Anyone with known risk factors for hepatitis C. Sexually transmitted infections (STIs)  You should be screened each year for STIs, including gonorrhea and chlamydia, if: ? You are sexually active and are younger than 55 years of age. ? You are older than 55 years of age and your health care provider tells you that you are at risk for this type of infection. ? Your sexual activity has changed since you were last screened, and you are at increased risk for chlamydia or gonorrhea. Ask your health care provider if you are at risk.  Ask your health care provider about whether you are at high risk for HIV. Your health care provider may recommend a prescription medicine to help prevent HIV infection. If you choose to take medicine to prevent HIV, you should first get tested for HIV. You should then be tested every 3 months for as long as you are taking the medicine. Follow these instructions at home: Lifestyle  Do not use any products that contain nicotine or tobacco, such as cigarettes, e-cigarettes, and chewing tobacco. If you need help quitting, ask your health care provider.  Do not use street drugs.  Do not share needles.  Ask your health care provider for help if you need support or information about quitting drugs. Alcohol use  Do not drink alcohol if your health care provider tells you not to drink.  If you drink alcohol: ? Limit how much you have to 0-2 drinks a day. ? Be aware of how much alcohol is in your drink. In the [place] one drink equals one 12  oz bottle of beer (355 mL), one 5 oz glass of wine (148 mL), or one 1 oz glass of hard liquor (44 mL). General instructions  Schedule regular health, dental, and eye exams.  Stay current with your vaccines.  Tell your health care provider if: ? You often feel depressed. ? You have ever been abused or do not feel safe at home. Summary  Adopting a healthy lifestyle and getting preventive care are important in promoting health and wellness.  Follow your health care provider's instructions about healthy diet, exercising, and getting tested or screened for diseases.  Follow your health care provider's instructions on monitoring your cholesterol and blood pressure. This information is not intended to replace advice given to you by your health care provider. Make sure you discuss any questions you have with your health care provider. Document Revised: 01/08/2018 Document Reviewed: 01/08/2018 Elsevier Patient Education  2020 Elsevier Inc.  

## 2020-01-11 NOTE — Progress Notes (Signed)
Steven Morales 54 y.o.   Chief Complaint  Patient presents with  . Back Pain    Per patient for 3 days in the middle and penis itching   . Urinary Frequency    Per patient he wants to be checked for diabetes    HISTORY OF PRESENT ILLNESS: This is a 54 y.o. male complaining of itching in the genital area for several days. Also complaining of urinary frequency, nocturia x3 per night.  Concerned about possible diabetes. Also complaining of occasional low back pain for the past 3 days.  Constant sharp pain without radiation worse with movement and not associated with any other symptoms. No other complaints or medical concerns today.  HPI   Prior to Admission medications   Medication Sig Start Date End Date Taking? Authorizing Provider  cetirizine (ZYRTEC) 10 MG tablet  10/03/15  Yes [provider]  ibuprofen (ADVIL,MOTRIN) 600 MG tablet Take 1 tablet (600 mg total) by mouth every 6 (six) hours as needed. 08/17/16  Yes Cardama, Grayce Sessions, MD  levothyroxine (SYNTHROID, LEVOTHROID) 150 MCG tablet Take 1 tablet (150 mcg total) by mouth daily before breakfast. 08/10/15  Yes Daub, Loura Back, MD  losartan (COZAAR) 100 MG tablet Take 1 tablet (100 mg total) by mouth daily. 12/17/19  Yes Sueanne Margarita, MD  Olopatadine HCl 0.2 % SOLN  10/29/15  Yes [provider]  Cholecalciferol (VITAMIN D-3) 1000 UNITS CAPS Take 1 capsule by mouth daily. Reported on 08/09/2015 Patient not taking: Reported on 01/11/2020    [provider]  FLUTICASONE-SALMETEROL IN Inhale into the lungs as needed.  Patient not taking: Reported on 01/11/2020    [provider]  furosemide (LASIX) 40 MG tablet Take 40 mg by mouth as needed.  Patient not taking: Reported on 01/11/2020 10/03/15   [provider]  hydrochlorothiazide (HYDRODIURIL) 25 MG tablet  10/03/15   [provider]  simvastatin (ZOCOR) 40 MG tablet  10/29/15   [provider]    No Known  Allergies  Patient Active Problem List   Diagnosis Date Noted  . Essential hypertension 07/29/2017  . Muscle pain 02/27/2017  . 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 Bilateral arm pain 02/27/2017  . Degenerative disc disease, lumbar 10/31/2015  . Bradycardia 10/31/2015  . Pain in right elbow 11/29/2014  . Hypothyroidism following radioiodine therapy 06/24/2014    Past Medical History:  Diagnosis Date  . Acute pharyngitis   . Allergy   . Anemia   . Bradycardia   . 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 Dry eye syndrome of both lacrimal glands   . Elevated blood pressure reading without diagnosis of hypertension   . Hyperlipidemia   . Hypertension   . Hypothyroidism   . Memory loss   . Vitamin A deficiency     Past Surgical History:  Procedure Laterality Date  . BACK SURGERY      Social History   Socioeconomic History  . Marital status: Divorced    Spouse name: Not on file  . Number of children: 6  . Years of education: HS  . Highest education level: Not on file  Occupational History    Employer: [employer]    Comment: [place]  Tobacco Use  . Smoking status: Never Smoker  . Smokeless tobacco: Never Used  Substance and Sexual Activity  . Alcohol use: No  . Drug use: No  . Sexual activity: Not on file  Other Topics Concern  .  Not on file  Social History Narrative   Patient lives at home with his spouse.   Caffeine Use: 2-3 cups daily   Social Determinants of Health   Financial Resource Strain: Not on file  Food Insecurity: Not on file  Transportation Needs: Not on file  Physical Activity: Not on file  Stress: Not on file  Social Connections: Not on file  Intimate Partner Violence: Not on file    Family History  Problem Relation Age of Onset  . Hypertension Father   . Thyroid disease Father   . Diabetes Father      Review of Systems  Constitutional: Negative.  Negative for chills and fever.  HENT: Negative.  Negative for  congestion and sore throat.   Respiratory: Negative.  Negative for cough and shortness of breath.   Cardiovascular: Negative.  Negative for chest pain and palpitations.  Gastrointestinal: Negative.  Negative for abdominal pain, diarrhea, nausea and vomiting.  Genitourinary: Positive for frequency and urgency. Negative for dysuria, flank pain and hematuria.  Musculoskeletal: Positive for back pain.  Skin: Positive for itching and rash (Genital area).  Neurological: Negative.  Negative for dizziness and headaches.  All other systems reviewed and are negative.   Today's Vitals   01/11/20 1038  BP: 135/82  Pulse: (!) 41  Resp: 16  Temp: 98.3 F (36.8 C)  TempSrc: Temporal  SpO2: 99%  Weight: 199 lb (90.3 kg)  Height: 6' (1.829 m)   Body mass index is 26.99 kg/m.  Physical Exam Vitals reviewed.  Constitutional:      Appearance: Normal appearance.  HENT:     Head: Normocephalic.  Eyes:     Extraocular Movements: Extraocular movements intact.     Pupils: Pupils are equal, round, and reactive to light.  Cardiovascular:     Rate and Rhythm: Regular rhythm. Bradycardia present.     Pulses: Normal pulses.     Heart sounds: Normal heart sounds.  Pulmonary:     Effort: Pulmonary effort is normal.     Breath sounds: Normal breath sounds.  Abdominal:     General: There is no distension.     Palpations: Abdomen is soft.     Tenderness: There is no abdominal tenderness.     Hernia: There is no hernia in the left inguinal area or right inguinal area.  Genitourinary:    Penis: Circumcised.      Testes: Normal.     Epididymis:     Right: Normal.     Left: Normal.     Comments: Dry scaly rash to shaft and scrotal area Musculoskeletal:     Cervical back: Normal range of motion.     Thoracic back: Normal.     Lumbar back: Normal. No spasms, tenderness or bony tenderness. Normal range of motion.  Lymphadenopathy:     Lower Body: No right inguinal adenopathy. No left inguinal  adenopathy.  Skin:    General: Skin is warm and dry.     Capillary Refill: Capillary refill takes less than 2 seconds.     Comments: Large lipoma on left side of his torso  Neurological:     General: No focal deficit present.     Mental Status: He is alert and oriented to person, place, and time.  Psychiatric:        Mood and Affect: Mood normal.        Behavior: Behavior normal.    Results for orders placed or performed in visit on 01/11/20 (from the past  24 hour(s))  POCT glucose (manual entry)     Status: None   Collection Time: 01/11/20 11:08 AM  Result Value Ref Range   POC Glucose 87 70 - 99 mg/dl  POCT glycosylated hemoglobin (Hb A1C)     Status: None   Collection Time: 01/11/20 11:09 AM  Result Value Ref Range   Hemoglobin A1C 5.1 4.0 - 5.6 %   HbA1c POC (<> result, manual entry)     HbA1c, POC (prediabetic range)     HbA1c, POC (controlled diabetic range)       ASSESSMENT & PLAN: Inocente was seen today for back pain and urinary frequency.  Diagnoses and all orders for this visit:  Urinary frequency -     POCT glucose (manual entry) -     POCT glycosylated hemoglobin (Hb A1C) -     POCT urinalysis dipstick -     Urine Culture  Itching of male genitalia -     clotrimazole-betamethasone (LOTRISONE) cream; Apply 1 application topically 2 (two) times daily.  Lower urinary tract symptoms (LUTS) -     PSA -     Ambulatory referral to Urology  Essential hypertension -     Comprehensive metabolic panel  Lipoma of torso  Bradycardia  Hypothyroidism following radioiodine therapy -     TSH  Erectile dysfunction, unspecified erectile dysfunction type -     sildenafil (VIAGRA) 100 MG tablet; Take 0.5-1 tablets (50-100 mg total) by mouth daily as needed for erectile dysfunction.  Musculoskeletal back pain     Patient Instructions       If you have lab work done today you will be contacted with your lab results within the next 2 weeks.  If you have not  heard from [place] then please contact us. The fastest way to get your results is to register for My Chart.   IF you received an x-ray today, you will receive an invoice from [facility]. Please contact [facility] at [phone] with questions or concerns regarding your invoice.   IF you received labwork today, you will receive an invoice from [facility]. Please contact [facility] at [phone] with questions or concerns regarding your invoice.   Our billing staff will not be able to assist you with questions regarding bills from these companies.  You will be contacted with the lab results as soon as they are available. The fastest way to get your results is to activate your My Chart account. Instructions are located on the last page of this paperwork. If you have not heard from [place] regarding the results in 2 weeks, please contact this office.     Health Maintenance, Male Adopting a healthy lifestyle and getting preventive care are important in promoting health and wellness. Ask your health care provider about:  The right schedule for you to have regular tests and exams.  Things you can do on your own to prevent diseases and keep yourself healthy. What should I know about diet, weight, and exercise? Eat a healthy diet   Eat a diet that includes plenty of vegetables, fruits, low-fat dairy products, and lean protein.  Do not eat a lot of foods that are high in solid fats, added sugars, or sodium. Maintain a healthy weight Body mass index (BMI) is a measurement that can be used to identify possible weight problems. It estimates body fat based on height and weight. Your health care provider can help determine your BMI and help you achieve or maintain a healthy weight. Get  regular exercise Get regular exercise. This is one of the most important things you can do for your health. Most adults should:  Exercise for at least 150 minutes each week. The exercise should increase your  heart rate and make you sweat (moderate-intensity exercise).  Do strengthening exercises at least twice a week. This is in addition to the moderate-intensity exercise.  Spend less time sitting. Even light physical activity can be beneficial. Watch cholesterol and blood lipids Have your blood tested for lipids and cholesterol at 55 years of age, then have this test every 5 years. You may need to have your cholesterol levels checked more often if:  Your lipid or cholesterol levels are high.  You are older than 55 years of age.  You are at high risk for heart disease. What should I know about cancer screening? Many types of cancers can be detected early and may often be prevented. Depending on your health history and family history, you may need to have cancer screening at various ages. This may include screening for:  Colorectal cancer.  Prostate cancer.  Skin cancer.  Lung cancer. What should I know about heart disease, diabetes, and high blood pressure? Blood pressure and heart disease  High blood pressure causes heart disease and increases the risk of stroke. This is more likely to develop in people who have high blood pressure readings, are of African descent, or are overweight.  Talk with your health care provider about your target blood pressure readings.  Have your blood pressure checked: ? Every 3-5 years if you are 22-39 years of age. ? Every year if you are 75 years old or older.  If you are between the ages of 32 and 50 and are a current or former smoker, ask your health care provider if you should have a one-time screening for abdominal aortic aneurysm (AAA). Diabetes Have regular diabetes screenings. This checks your fasting blood sugar level. Have the screening done:  Once every three years after age 67 if you are at a normal weight and have a low risk for diabetes.  More often and at a younger age if you are overweight or have a high risk for diabetes. What  should I know about preventing infection? Hepatitis B If you have a higher risk for hepatitis B, you should be screened for this virus. Talk with your health care provider to find out if you are at risk for hepatitis B infection. Hepatitis C Blood testing is recommended for:  Everyone born from [birth year] through 1965.  Anyone with known risk factors for hepatitis C. Sexually transmitted infections (STIs)  You should be screened each year for STIs, including gonorrhea and chlamydia, if: ? You are sexually active and are younger than 55 years of age. ? You are older than 55 years of age and your health care provider tells you that you are at risk for this type of infection. ? Your sexual activity has changed since you were last screened, and you are at increased risk for chlamydia or gonorrhea. Ask your health care provider if you are at risk.  Ask your health care provider about whether you are at high risk for HIV. Your health care provider may recommend a prescription medicine to help prevent HIV infection. If you choose to take medicine to prevent HIV, you should first get tested for HIV. You should then be tested every 3 months for as long as you are taking the medicine. Follow these instructions at home: Lifestyle  Do not use any products that contain nicotine or tobacco, such as cigarettes, e-cigarettes, and chewing tobacco. If you need help quitting, ask your health care provider.  Do not use street drugs.  Do not share needles.  Ask your health care provider for help if you need support or information about quitting drugs. Alcohol use  Do not drink alcohol if your health care provider tells you not to drink.  If you drink alcohol: ? Limit how much you have to 0-2 drinks a day. ? Be aware of how much alcohol is in your drink. In the [place] one drink equals one 12 oz bottle of beer (355 mL), one 5 oz glass of wine (148 mL), or one 1 oz glass of hard liquor (44 mL). General  instructions  Schedule regular health, dental, and eye exams.  Stay current with your vaccines.  Tell your health care provider if: ? You often feel depressed. ? You have ever been abused or do not feel safe at home. Summary  Adopting a healthy lifestyle and getting preventive care are important in promoting health and wellness.  Follow your health care provider's instructions about healthy diet, exercising, and getting tested or screened for diseases.  Follow your health care provider's instructions on monitoring your cholesterol and blood pressure. This information is not intended to replace advice given to you by your health care provider. Make sure you discuss any questions you have with your health care provider. Document Revised: 01/08/2018 Document Reviewed: 01/08/2018 Elsevier Patient Education  2020 Elsevier Inc.      Agustina Caroli, MD [facility]

## 2020-01-11 NOTE — Telephone Encounter (Signed)
What is the name of the medication? A pain med  Have you contacted your pharmacy to request a refill? Pt was just seen today 01/11/20 in our clinic, he is under the impression he is getting a pain med  Which pharmacy would you like this sent to? [facility]  [address], [place] [address]  Phone:  [phone] Fax:  [phone]       Patient notified that their request is being sent to the clinical staff for review and that they should receive a call once it is complete. If they do not receive a call within 72 hours they can check with their pharmacy or our office.

## 2020-01-12 ENCOUNTER — Other Ambulatory Visit: Payer: Self-pay | Admitting: Emergency Medicine

## 2020-01-12 DIAGNOSIS — E890 Postprocedural hypothyroidism: Secondary | ICD-10-CM

## 2020-01-12 LAB — COMPREHENSIVE METABOLIC PANEL
ALT: 11 IU/L (ref 0–44)
AST: 22 IU/L (ref 0–40)
Albumin/Globulin Ratio: 1.7 (ref 1.2–2.2)
Albumin: 4.7 g/dL (ref 3.8–4.9)
Alkaline Phosphatase: 49 IU/L (ref 44–121)
BUN/Creatinine Ratio: 12 (ref 9–20)
BUN: 14 mg/dL (ref 6–24)
Bilirubin Total: 1 mg/dL (ref 0.0–1.2)
CO2: 25 mmol/L (ref 20–29)
Calcium: 9.7 mg/dL (ref 8.7–10.2)
Chloride: 104 mmol/L (ref 96–106)
Creatinine, Ser: 1.2 mg/dL (ref 0.76–1.27)
GFR calc Af Amer: 78 mL/min/{1.73_m2} (ref >59)
GFR calc non Af Amer: 68 mL/min/{1.73_m2} (ref >59)
Globulin, Total: 2.8 g/dL (ref 1.5–4.5)
Glucose: 87 mg/dL (ref 65–99)
Potassium: 4 mmol/L (ref 3.5–5.2)
Sodium: 143 mmol/L (ref 134–144)
Total Protein: 7.5 g/dL (ref 6.0–8.5)

## 2020-01-12 LAB — TSH: TSH: 56.7 u[IU]/mL — ABNORMAL HIGH (ref 0.450–4.500)

## 2020-01-12 LAB — PSA: Prostate Specific Ag, Serum: 2.7 ng/mL (ref 0.0–4.0)

## 2020-01-12 NOTE — Telephone Encounter (Signed)
I spoke to Dr Mitchel Honour about the patient wanting a pain medication, but the patient did not mention anything to the doctor about pain at the office visit. The patient can take Tylenol or Advil for pain.

## 2020-01-13 LAB — URINE CULTURE

## 2020-01-14 ENCOUNTER — Encounter: Payer: BLUE CROSS/BLUE SHIELD | Admitting: Family Medicine

## 2020-01-15 ENCOUNTER — Encounter: Payer: Self-pay | Admitting: Family Medicine

## 2020-02-11 ENCOUNTER — Other Ambulatory Visit: Payer: Self-pay

## 2020-02-11 ENCOUNTER — Encounter: Payer: Self-pay | Admitting: Family Medicine

## 2020-02-11 ENCOUNTER — Ambulatory Visit (INDEPENDENT_AMBULATORY_CARE_PROVIDER_SITE_OTHER): Payer: BC Managed Care – PPO | Admitting: Family Medicine

## 2020-02-11 VITALS — BP 138/86 | HR 54 | Temp 97.6°F | Resp 15 | Ht 72.0 in | Wt 207.3 lb

## 2020-02-11 DIAGNOSIS — M25559 Pain in unspecified hip: Secondary | ICD-10-CM

## 2020-02-11 DIAGNOSIS — R001 Bradycardia, unspecified: Secondary | ICD-10-CM

## 2020-02-11 DIAGNOSIS — E890 Postprocedural hypothyroidism: Secondary | ICD-10-CM

## 2020-02-11 DIAGNOSIS — Z1211 Encounter for screening for malignant neoplasm of colon: Secondary | ICD-10-CM | POA: Diagnosis not present

## 2020-02-11 DIAGNOSIS — L293 Anogenital pruritus, unspecified: Secondary | ICD-10-CM

## 2020-02-11 DIAGNOSIS — N529 Male erectile dysfunction, unspecified: Secondary | ICD-10-CM

## 2020-02-11 DIAGNOSIS — R350 Frequency of micturition: Secondary | ICD-10-CM

## 2020-02-11 DIAGNOSIS — I10 Essential (primary) hypertension: Secondary | ICD-10-CM

## 2020-02-11 DIAGNOSIS — Z0000 Encounter for general adult medical examination without abnormal findings: Secondary | ICD-10-CM

## 2020-02-11 DIAGNOSIS — Z0001 Encounter for general adult medical examination with abnormal findings: Secondary | ICD-10-CM

## 2020-02-11 MED ORDER — FLUTICASONE PROPIONATE 50 MCG/ACT NA SUSP: 2.0000 | Freq: Every day | NASAL | 6 refills | Status: AC

## 2020-02-11 MED ORDER — CLOTRIMAZOLE-BETAMETHASONE 1-0.05 % EX CREA: 1.0000 "application " | TOPICAL_CREAM | Freq: Two times a day (BID) | CUTANEOUS | 2 refills | Status: AC

## 2020-02-11 NOTE — Patient Instructions (Addendum)
  Your physical examination looks good.  I am glad that you have gotten all of the COVID vaccines.  The American Heart Association recommends that every body try to get at least 5 days a week of a regular active exercise such as walking or running or swimming or gym bicycle or such things.  I encourage you to increase your exercise.  Your thyroid test from 2 months ago showed you still have an underactive thyroid.  Make sure you keep your appointment with Dr. Loanne Drilling on 03/22/2020 at 1245 as you showed me.  He is the thyroid specialist.  You also have an appointment with Dr. Junious Silk at [facility] urology on 02/24/2020 at 11 AM that you showed me.  He can talk to you about your leaking of urine and urgency of urine, about your erectile problems when you have sex, and check on your prostate gland.  I am making a referral for you to an orthopedic doctor to assist you for your hip pains.  I am making a referral for you to a gastroenterologist (intestine specialist) to sometime have a screening colonoscopy.  This is recommended for all people over 50 to make sure you do not have any early cancers of your colon.  Continue the losartan for your blood pressure.  I am giving you a nose spray, fluticasone (Flonase) to use 2 sprays in your nose each night for your congestion.  Continue your eyedrops as needed.  The list of your medicines on the computer is not entirely accurate.  I encourage you to put all your medicines in a bag next time you see the doctor and let them make sure the list is correct.   As I discussed with you, I encourage you to continue to seek to be physically, emotionally, relationally, and spiritually healthy.      If you have lab work done today you will be contacted with your lab results within the next 2 weeks.  If you have not heard from [place] then please contact us. The fastest way to get your results is to register for My Chart.   IF you received an x-ray today, you will  receive an invoice from [facility]. Please contact [facility] at [phone] with questions or concerns regarding your invoice.   IF you received labwork today, you will receive an invoice from [facility]. Please contact [facility] at [phone] with questions or concerns regarding your invoice.   Our billing staff will not be able to assist you with questions regarding bills from these companies.  You will be contacted with the lab results as soon as they are available. The fastest way to get your results is to activate your My Chart account. Instructions are located on the last page of this paperwork. If you have not heard from [place] regarding the results in 2 weeks, please contact this office.
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
## 2020-02-12 ENCOUNTER — Other Ambulatory Visit: Payer: Self-pay | Admitting: Family Medicine

## 2020-02-12 DIAGNOSIS — E7800 Pure hypercholesterolemia, unspecified: Secondary | ICD-10-CM

## 2020-02-12 LAB — HEMOGLOBIN A1C
Est. average glucose Bld gHb Est-mCnc: 103 mg/dL
Hgb A1c MFr Bld: 5.2 % (ref 4.8–5.6)

## 2020-02-12 LAB — LIPID PANEL
Chol/HDL Ratio: 4.5 ratio (ref 0.0–5.0)
Cholesterol, Total: 334 mg/dL — ABNORMAL HIGH (ref 100–199)
HDL: 74 mg/dL (ref >39)
LDL Chol Calc (NIH): 249 mg/dL — ABNORMAL HIGH (ref 0–99)
Triglycerides: 75 mg/dL (ref 0–149)
VLDL Cholesterol Cal: 11 mg/dL (ref 5–40)

## 2020-02-12 MED ORDER — SIMVASTATIN 40 MG PO TABS
ORAL_TABLET | ORAL | 3 refills | Status: DC
Start: 2020-02-12 — End: 2020-03-11

## 2020-02-16 ENCOUNTER — Telehealth: Payer: Self-pay

## 2020-02-16 NOTE — Telephone Encounter (Signed)
Hi Steven Morales, this pt is requesting a refill on Losartan 100mg , but [facility] pharmacy is saying that the 100mg  tabs are on backorder. They do have 50mg  and 25mg .They need to know if they can change to either the 50mg  tabs or 25mg . Please send in new direction. Thanks

## 2020-02-17 ENCOUNTER — Other Ambulatory Visit: Payer: Self-pay

## 2020-02-17 DIAGNOSIS — H04203 Unspecified epiphora, bilateral lacrimal glands: Secondary | ICD-10-CM | POA: Diagnosis not present

## 2020-02-17 DIAGNOSIS — H401133 Primary open-angle glaucoma, bilateral, severe stage: Secondary | ICD-10-CM | POA: Diagnosis not present

## 2020-02-17 DIAGNOSIS — H11823 Conjunctivochalasis, bilateral: Secondary | ICD-10-CM | POA: Diagnosis not present

## 2020-02-17 DIAGNOSIS — H04123 Dry eye syndrome of bilateral lacrimal glands: Secondary | ICD-10-CM | POA: Diagnosis not present

## 2020-02-17 MED ORDER — LOSARTAN POTASSIUM 50 MG PO TABS: 100.0000 mg | ORAL_TABLET | Freq: Every day | ORAL | 3 refills | Status: AC

## 2020-02-26 DIAGNOSIS — M25551 Pain in right hip: Secondary | ICD-10-CM | POA: Diagnosis not present

## 2020-02-26 DIAGNOSIS — M25552 Pain in left hip: Secondary | ICD-10-CM | POA: Diagnosis not present

## 2020-02-26 DIAGNOSIS — M5416 Radiculopathy, lumbar region: Secondary | ICD-10-CM | POA: Diagnosis not present

## 2020-03-11 ENCOUNTER — Other Ambulatory Visit: Payer: Self-pay

## 2020-03-11 ENCOUNTER — Ambulatory Visit (AMBULATORY_SURGERY_CENTER): Payer: Self-pay

## 2020-03-11 VITALS — Ht 72.0 in | Wt 201.0 lb

## 2020-03-11 DIAGNOSIS — Z1211 Encounter for screening for malignant neoplasm of colon: Secondary | ICD-10-CM

## 2020-03-11 NOTE — Progress Notes (Signed)
No egg or soy allergy known to patient  No issues with past sedation with any surgeries or procedures No intubation problems in the past  No FH of Malignant Hyperthermia No diet pills per patient No home 02 use per patient  No blood thinners per patient  Pt denies issues with constipation  No A fib or A flutter  COVID 19 guidelines implemented in [place] today with Pt and RN  Pt is fully vaccinated for Covid x 2 + booster= Pt denies loose or missing teeth; Patient denies dentures, partials, dental implants, capped or bonded teeth Discussed with pt there will be an out-of-pocket cost for prep and that varies from $0 to 70 dollars  Due to the COVID-19 pandemic we are asking patients to follow certain guidelines.  Pt aware of COVID protocols and [facility] guidelines

## 2020-03-16 ENCOUNTER — Telehealth: Payer: Self-pay | Admitting: Gastroenterology

## 2020-03-16 NOTE — Telephone Encounter (Signed)
Good afternoon Dr. Ardis Hughs, patient called stating he will be out of town and cancelled his procedure scheduled for 03/22/2020.  He will call back to reschedule.

## 2020-03-18 ENCOUNTER — Other Ambulatory Visit: Payer: Self-pay

## 2020-03-22 ENCOUNTER — Other Ambulatory Visit: Payer: Self-pay

## 2020-03-22 ENCOUNTER — Encounter: Payer: BC Managed Care – PPO | Admitting: Gastroenterology

## 2020-03-22 ENCOUNTER — Ambulatory Visit: Payer: BC Managed Care – PPO | Admitting: Endocrinology

## 2020-03-22 VITALS — BP 170/90 | HR 58 | Ht 72.0 in | Wt 197.8 lb

## 2020-03-22 DIAGNOSIS — E890 Postprocedural hypothyroidism: Secondary | ICD-10-CM

## 2020-03-22 MED ORDER — LEVOTHYROXINE SODIUM 200 MCG PO TABS: 200.0000 ug | ORAL_TABLET | Freq: Every day | ORAL | 3 refills | Status: DC

## 2020-03-22 NOTE — Patient Instructions (Addendum)
Your blood pressure is high today.  Please see your primary care provider soon, to have it rechecked. I have sent a prescription to your pharmacy, to increase the levothyroxine.   It is best to never miss the medication.  However, if you do miss it, next best is to double up the next time.   Please come back for a follow-up appointment in 6 weeks.        Levothyroxine tablets What is this medicine? LEVOTHYROXINE (lee voe thye ROX een) is a thyroid hormone. This medicine can improve symptoms of thyroid deficiency such as slow speech, lack of energy, weight gain, hair loss, dry skin, and feeling cold. It also helps to treat goiter (an enlarged thyroid gland). It is also used to treat some kinds of thyroid cancer along with surgery and other medicines. This medicine may be used for other purposes; ask your health care provider or pharmacist if you have questions. COMMON BRAND NAME(S): Estre, Euthyrox, Levo-T, Levothroid, Levoxyl, Synthroid, Thyro-Tabs, Unithroid What should I tell my health care provider before I take this medicine? They need to know if you have any of these conditions:  Addison's disease or other adrenal gland problem  angina  bone problems  diabetes  dieting or on a weight loss program  fertility problems  heart disease  pituitary gland problem  take medicines that treat or prevent blood clots  an unusual or allergic reaction to levothyroxine, thyroid hormones, other medicines, foods, dyes, or preservatives  pregnant or trying to get pregnant  breast-feeding How should I use this medicine? Take this medicine by mouth with plenty of water. It is best to take on an empty stomach, at least 30 minutes to one hour before breakfast. Avoid taking antacids containing aluminum or magnesium, simethicone, bile acid sequestrants, calcium carbonate, sodium polystyrene sulfonate, ferrous sulfate, sevelamer, lanthanum, or sucralfate within 4 hours of taking this medicine.  Follow the directions on the prescription label. Take at the same time each day. Do not take your medicine more often than directed. Contact your pediatrician regarding the use of this medicine in children. While this drug may be prescribed for children and infants as young as a few days of age for selected conditions, precautions do apply. For infants, you may crush the tablet and place in a small amount of (5 to 10 mL or 1 to 2 teaspoonfuls) of water, breast milk, or non-soy based infant formula. Do not mix with soy-based infant formula. Give as directed. Overdosage: If you think you have taken too much of this medicine contact a poison control center or emergency room at once. NOTE: This medicine is only for you. Do not share this medicine with others. What if I miss a dose? If you miss a dose, take it as soon as you can. If it is almost time for your next dose, take only that dose. Do not take double or extra doses. What may interact with this medicine?  amiodarone  antacids  anti-thyroid medicines  calcium supplements  carbamazepine  certain medicines for depression  certain medicines to treat cancer  cholestyramine  clofibrate  colesevelam  colestipol  digoxin  male hormones, like estrogens and birth control pills, patches, rings, or injections  iron supplements  ketamine  lanthanum  liquid nutrition products like Ensure  lithium  medicines for colds and breathing difficulties  medicines for diabetes  medicines or dietary supplements for weight loss  methadone  niacin  orlistat  oxandrolone  phenobarbital or other barbiturates  phenytoin  rifampin  sevelamer  simethicone  sodium polystyrene sulfonate  soy isoflavones  steroid medicines like prednisone or cortisone  sucralfate  testosterone  theophylline  warfarin This list may not describe all possible interactions. Give your health care provider a list of all the medicines,  herbs, non-prescription drugs, or dietary supplements you use. Also tell them if you smoke, drink alcohol, or use illegal drugs. Some items may interact with your medicine. What should I watch for while using this medicine? Be sure to take this medicine with plenty of fluids. Some tablets may cause choking, gagging, or difficulty swallowing from the tablet getting stuck in your throat. Most of these problems disappear if the medicine is taken with the right amount of water or other fluids. Do not switch brands of this medicine unless your health care professional agrees with the change. Ask questions if you are uncertain. You will need regular exams and occasional blood tests to check the response to treatment. If you are receiving this medicine for an underactive thyroid, it may be several weeks before you notice an improvement. Check with your doctor or health care professional if your symptoms do not improve. It may be necessary for you to take this medicine for the rest of your life. Do not stop using this medicine unless your doctor or health care professional advises you to. This medicine can affect blood sugar levels. If you have diabetes, check your blood sugar as directed. You may lose some of your hair when you first start treatment. With time, this usually corrects itself. If you are going to have surgery, tell your doctor or health care professional that you are taking this medicine. What side effects may I notice from receiving this medicine? Side effects that you should report to your doctor or health care professional as soon as possible:  allergic reactions like skin rash, itching or hives, swelling of the face, lips, or tongue  anxious  breathing problems  changes in menstrual periods  chest pain  diarrhea  excessive sweating or intolerance to heat  fast or irregular heartbeat  leg cramps  nervousness  swelling of ankles, feet, or legs  tremors  trouble  sleeping  vomiting Side effects that usually do not require medical attention (report to your doctor or health care professional if they continue or are bothersome):  changes in appetite  headache  irritable  nausea  weight loss This list may not describe all possible side effects. Call your doctor for medical advice about side effects. You may report side effects to FDA at 1-800-FDA-1088. Where should I keep my medicine? Keep out of the reach of children. Store at room temperature between 15 and 30 degrees C (59 and 86 degrees F). Protect from light and moisture. Keep container tightly closed. Throw away any unused medicine after the expiration date. NOTE: This sheet is a summary. It may not cover all possible information. If you have questions about this medicine, talk to your doctor, pharmacist, or health care provider.  2021 Elsevier/Gold Standard (2019-01-08 13:39:26)

## 2020-03-22 NOTE — Progress Notes (Signed)
Subjective:    Patient ID: Steven Morales, male    DOB: [birth year], 55 y.o.   MRN: [id number]  HPI Pt is ref by Dr Mitchel Honour, for hypothyroidism.  He had RAI for hyperthyroidism, in 2013 (scan showed diffuse goiter).   He has been on synthroid since then.  Pt says he has been on 150 mcg/d, since late 2021.  He reports leg cramps, ED, cold intolerance, constipation, and dry skin.  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   Past Medical History:  Diagnosis Date  . Acute pharyngitis   . Allergy    seasonal allergies  . Anemia    hx of  . Bradycardia   . 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 Dry eye syndrome of both lacrimal glands   . Elevated blood pressure reading without diagnosis of hypertension   . Hyperlipidemia    on meds  . Hypertension    on meds  . Hypothyroidism    on meds  . Memory loss   . Vitamin A deficiency     Past Surgical History:  Procedure Laterality Date  . BACK SURGERY      Social History   Socioeconomic History  . Marital status: Divorced    Spouse name: Not on file  . Number of children: 6  . Years of education: HS  . Highest education level: Not on file  Occupational History    Employer: [employer]    Comment: [place]  Tobacco Use  . Smoking status: Never Smoker  . Smokeless tobacco: Never Used  Substance and Sexual Activity  . Alcohol use: No  . Drug use: No  . Sexual activity: Not on file  Other Topics Concern  . Not on file  Social History Narrative   Patient lives at home with his spouse.   Caffeine Use: 2-3 cups daily   Social Determinants of Health   Financial Resource Strain: Not on file  Food Insecurity: Not on file  Transportation Needs: Not on file  Physical Activity: Not on file  Stress: Not on file  Social Connections: Not on file  Intimate Partner Violence: Not on file    Current Outpatient Medications on File Prior to Visit  Medication Sig Dispense Refill  . cetirizine  (ZYRTEC) 10 MG tablet     . Cholecalciferol (VITAMIN D-3) 1000 UNITS CAPS Take 1 capsule by mouth daily. Reported on 08/09/2015    . Cholecalciferol (VITAMIN D3) 1.25 MG (50000 UT) CAPS Take 1 capsule by mouth once a week.    . clotrimazole-betamethasone (LOTRISONE) cream Apply 1 application topically 2 (two) times daily. 30 g 2  . fluticasone (FLONASE) 50 MCG/ACT nasal spray Place 2 sprays into both nostrils daily. 16 g 6  . FLUTICASONE-SALMETEROL IN Inhale into the lungs as needed.    . furosemide (LASIX) 40 MG tablet Take 40 mg by mouth as needed.    . hydrochlorothiazide (HYDRODIURIL) 25 MG tablet     . ibuprofen (ADVIL,MOTRIN) 600 MG tablet Take 1 tablet (600 mg total) by mouth every 6 (six) hours as needed. 30 tablet 0  . lisinopril (ZESTRIL) 10 MG tablet Take 10 mg by mouth daily.    Marland Kitchen losartan (COZAAR) 50 MG tablet Take 2 tablets (100 mg total) by mouth daily. 180 tablet 3  . ROCKLATAN 0.02-0.005 % SOLN Apply 1 drop to eye daily.    . sildenafil (VIAGRA) 100 MG tablet Take 0.5-1 tablets (50-100 mg total)  by mouth daily as needed for erectile dysfunction. 5 tablet 11  . timolol (TIMOPTIC) 0.5 % ophthalmic solution Place 1 drop into both eyes every morning.     No current facility-administered medications on file prior to visit.    No Known Allergies  Family History  Problem Relation Age of Onset  . Hypertension Father   . Thyroid disease Father   . Diabetes Father   . Colon cancer Neg Hx   . Colon polyps Neg Hx   . Esophageal cancer Neg Hx   . Stomach cancer Neg Hx   . Rectal cancer Neg Hx     BP (!) 170/90 (BP Location: Right Arm, Patient Position: Sitting, Cuff Size: Large)   Pulse (!) 58   Ht 6' (1.829 m)   Wt 197 lb 12.8 oz (89.7 kg)   SpO2 98%   BMI 26.83 kg/m    Review of Systems denies weight gain and memory loss.      Objective:   Physical Exam VS: see vs page GEN: no distress HEAD: head: no deformity eyes: no periorbital swelling, no proptosis external  nose and ears are normal NECK: supple, thyroid is not enlarged CHEST WALL: no deformity LUNGS: clear to auscultation.   CV: reg rate and rhythm, no murmur.  MUSCULOSKELETAL: gait is normal and steady EXTEMITIES: no deformity.  Trace bilat leg edema.   NEURO:  readily moves all 4's.  sensation is intact to touch on all 4's SKIN:  Normal texture and temperature.  No rash or suspicious lesion is visible.   NODES:  None palpable at the neck.   PSYCH: alert, well-oriented.  Does not appear anxious nor depressed.     Lab Results  Component Value Date   TSH 56.700 (H) 01/11/2020   I have reviewed outside records, and summarized: Pt was noted to have elevated TSH, and referred here.  He was noted to have inter med compliance.   I last saw this pt in 2016.       Assessment & Plan:  HTN: is noted today.  Hypothyroidism: uncontrolled, for uncertain reason.  Patient Instructions  Your blood pressure is high today.  Please see your primary care provider soon, to have it rechecked. I have sent a prescription to your pharmacy, to increase the levothyroxine.   It is best to never miss the medication.  However, if you do miss it, next best is to double up the next time.   Please come back for a follow-up appointment in 6 weeks.        Levothyroxine tablets What is this medicine? LEVOTHYROXINE (lee voe thye ROX een) is a thyroid hormone. This medicine can improve symptoms of thyroid deficiency such as slow speech, lack of energy, weight gain, hair loss, dry skin, and feeling cold. It also helps to treat goiter (an enlarged thyroid gland). It is also used to treat some kinds of thyroid cancer along with surgery and other medicines. This medicine may be used for other purposes; ask your health care provider or pharmacist if you have questions. COMMON BRAND NAME(S): Estre, Euthyrox, Levo-T, Levothroid, Levoxyl, Synthroid, Thyro-Tabs, Unithroid What should I tell my health care provider before I  take this medicine? They need to know if you have any of these conditions:  Addison's disease or other adrenal gland problem  angina  bone problems  diabetes  dieting or on a weight loss program  fertility problems  heart disease  pituitary gland problem  take medicines that treat or  prevent blood clots  an unusual or allergic reaction to levothyroxine, thyroid hormones, other medicines, foods, dyes, or preservatives  pregnant or trying to get pregnant  breast-feeding How should I use this medicine? Take this medicine by mouth with plenty of water. It is best to take on an empty stomach, at least 30 minutes to one hour before breakfast. Avoid taking antacids containing aluminum or magnesium, simethicone, bile acid sequestrants, calcium carbonate, sodium polystyrene sulfonate, ferrous sulfate, sevelamer, lanthanum, or sucralfate within 4 hours of taking this medicine. Follow the directions on the prescription label. Take at the same time each day. Do not take your medicine more often than directed. Contact your pediatrician regarding the use of this medicine in children. While this drug may be prescribed for children and infants as young as a few days of age for selected conditions, precautions do apply. For infants, you may crush the tablet and place in a small amount of (5 to 10 mL or 1 to 2 teaspoonfuls) of water, breast milk, or non-soy based infant formula. Do not mix with soy-based infant formula. Give as directed. Overdosage: If you think you have taken too much of this medicine contact a poison control center or emergency room at once. NOTE: This medicine is only for you. Do not share this medicine with others. What if I miss a dose? If you miss a dose, take it as soon as you can. If it is almost time for your next dose, take only that dose. Do not take double or extra doses. What may interact with this medicine?  amiodarone  antacids  anti-thyroid medicines  calcium  supplements  carbamazepine  certain medicines for depression  certain medicines to treat cancer  cholestyramine  clofibrate  colesevelam  colestipol  digoxin  male hormones, like estrogens and birth control pills, patches, rings, or injections  iron supplements  ketamine  lanthanum  liquid nutrition products like Ensure  lithium  medicines for colds and breathing difficulties  medicines for diabetes  medicines or dietary supplements for weight loss  methadone  niacin  orlistat  oxandrolone  phenobarbital or other barbiturates  phenytoin  rifampin  sevelamer  simethicone  sodium polystyrene sulfonate  soy isoflavones  steroid medicines like prednisone or cortisone  sucralfate  testosterone  theophylline  warfarin This list may not describe all possible interactions. Give your health care provider a list of all the medicines, herbs, non-prescription drugs, or dietary supplements you use. Also tell them if you smoke, drink alcohol, or use illegal drugs. Some items may interact with your medicine. What should I watch for while using this medicine? Be sure to take this medicine with plenty of fluids. Some tablets may cause choking, gagging, or difficulty swallowing from the tablet getting stuck in your throat. Most of these problems disappear if the medicine is taken with the right amount of water or other fluids. Do not switch brands of this medicine unless your health care professional agrees with the change. Ask questions if you are uncertain. You will need regular exams and occasional blood tests to check the response to treatment. If you are receiving this medicine for an underactive thyroid, it may be several weeks before you notice an improvement. Check with your doctor or health care professional if your symptoms do not improve. It may be necessary for you to take this medicine for the rest of your life. Do not stop using this medicine  unless your doctor or health care professional advises you to. This  medicine can affect blood sugar levels. If you have diabetes, check your blood sugar as directed. You may lose some of your hair when you first start treatment. With time, this usually corrects itself. If you are going to have surgery, tell your doctor or health care professional that you are taking this medicine. What side effects may I notice from receiving this medicine? Side effects that you should report to your doctor or health care professional as soon as possible:  allergic reactions like skin rash, itching or hives, swelling of the face, lips, or tongue  anxious  breathing problems  changes in menstrual periods  chest pain  diarrhea  excessive sweating or intolerance to heat  fast or irregular heartbeat  leg cramps  nervousness  swelling of ankles, feet, or legs  tremors  trouble sleeping  vomiting Side effects that usually do not require medical attention (report to your doctor or health care professional if they continue or are bothersome):  changes in appetite  headache  irritable  nausea  weight loss This list may not describe all possible side effects. Call your doctor for medical advice about side effects. You may report side effects to FDA at 1-800-FDA-1088. Where should I keep my medicine? Keep out of the reach of children. Store at room temperature between 15 and 30 degrees C (59 and 86 degrees F). Protect from light and moisture. Keep container tightly closed. Throw away any unused medicine after the expiration date. NOTE: This sheet is a summary. It may not cover all possible information. If you have questions about this medicine, talk to your doctor, pharmacist, or health care provider.  2021 [employer] (2019-01-08 13:39:26)

## 2020-03-25 DIAGNOSIS — M5416 Radiculopathy, lumbar region: Secondary | ICD-10-CM | POA: Diagnosis not present

## 2020-03-25 DIAGNOSIS — M5126 Other intervertebral disc displacement, lumbar region: Secondary | ICD-10-CM | POA: Diagnosis not present

## 2020-03-25 DIAGNOSIS — R3915 Urgency of urination: Secondary | ICD-10-CM | POA: Diagnosis not present

## 2020-03-25 DIAGNOSIS — R3914 Feeling of incomplete bladder emptying: Secondary | ICD-10-CM | POA: Diagnosis not present

## 2020-03-25 DIAGNOSIS — M545 Low back pain, unspecified: Secondary | ICD-10-CM | POA: Diagnosis not present

## 2020-03-25 DIAGNOSIS — M5136 Other intervertebral disc degeneration, lumbar region: Secondary | ICD-10-CM | POA: Diagnosis not present

## 2020-03-25 DIAGNOSIS — N5202 Corporo-venous occlusive erectile dysfunction: Secondary | ICD-10-CM | POA: Diagnosis not present

## 2020-03-25 DIAGNOSIS — N401 Enlarged prostate with lower urinary tract symptoms: Secondary | ICD-10-CM | POA: Diagnosis not present

## 2020-04-07 DIAGNOSIS — H2000 Unspecified acute and subacute iridocyclitis: Secondary | ICD-10-CM | POA: Diagnosis not present

## 2020-04-11 ENCOUNTER — Encounter: Payer: Self-pay | Admitting: Emergency Medicine

## 2020-04-11 ENCOUNTER — Ambulatory Visit: Payer: BC Managed Care – PPO | Admitting: Emergency Medicine

## 2020-04-11 ENCOUNTER — Other Ambulatory Visit: Payer: Self-pay

## 2020-04-11 VITALS — BP 134/79 | HR 49 | Temp 97.9°F | Resp 16 | Ht 72.0 in | Wt 196.0 lb

## 2020-04-11 DIAGNOSIS — Z1211 Encounter for screening for malignant neoplasm of colon: Secondary | ICD-10-CM | POA: Diagnosis not present

## 2020-04-11 DIAGNOSIS — I10 Essential (primary) hypertension: Secondary | ICD-10-CM

## 2020-04-11 DIAGNOSIS — E890 Postprocedural hypothyroidism: Secondary | ICD-10-CM

## 2020-04-11 DIAGNOSIS — H2000 Unspecified acute and subacute iridocyclitis: Secondary | ICD-10-CM | POA: Diagnosis not present

## 2020-04-11 DIAGNOSIS — R001 Bradycardia, unspecified: Secondary | ICD-10-CM

## 2020-04-11 NOTE — Patient Instructions (Addendum)
If you have lab work done today you will be contacted with your lab results within the next 2 weeks.  If you have not heard from [place] then please contact us. The fastest way to get your results is to register for My Chart.   IF you received an x-ray today, you will receive an invoice from [facility]. Please contact [facility] at [phone] with questions or concerns regarding your invoice.   IF you received labwork today, you will receive an invoice from [facility]. Please contact [facility] at [phone] with questions or concerns regarding your invoice.   Our billing staff will not be able to assist you with questions regarding bills from these companies.  You will be contacted with the lab results as soon as they are available. The fastest way to get your results is to activate your My Chart account. Instructions are located on the last page of this paperwork. If you have not heard from [place] regarding the results in 2 weeks, please contact this office.     Hypothyroidism  Hypothyroidism is when the thyroid gland does not make enough of certain hormones (it is underactive). The thyroid gland is a small gland located in the lower front part of the neck, just in front of the windpipe (trachea). This gland makes hormones that help control how the body uses food for energy (metabolism) as well as how the heart and brain function. These hormones also play a role in keeping your bones strong. When the thyroid is underactive, it produces too little of the hormones thyroxine (T4) and triiodothyronine (T3). What are the causes? This condition may be caused by:  Hashimoto's disease. This is a disease in which the body's disease-fighting system (immune system) attacks the thyroid gland. This is the most common cause.  Viral infections.  Pregnancy.  Certain medicines.  Birth defects.  Past radiation treatments to the head or neck for cancer.  Past treatment with  radioactive iodine.  Past exposure to radiation in the environment.  Past surgical removal of part or all of the thyroid.  Problems with a gland in the center of the brain (pituitary gland).  Lack of enough iodine in the diet. What increases the risk? You are more likely to develop this condition if:  You are male.  You have a family history of thyroid conditions.  You use a medicine called lithium.  You take medicines that affect the immune system (immunosuppressants). What are the signs or symptoms? Symptoms of this condition include:  Feeling as though you have no energy (lethargy).  Not being able to tolerate cold.  Weight gain that is not explained by a change in diet or exercise habits.  Lack of appetite.  Dry skin.  Coarse hair.  Menstrual irregularity.  Slowing of thought processes.  Constipation.  Sadness or depression. How is this diagnosed? This condition may be diagnosed based on:  Your symptoms, your medical history, and a physical exam.  Blood tests. You may also have imaging tests, such as an ultrasound or MRI. How is this treated? This condition is treated with medicine that replaces the thyroid hormones that your body does not make. After you begin treatment, it may take several weeks for symptoms to go away. Follow these instructions at home:  Take over-the-counter and prescription medicines only as told by your health care provider.  If you start taking any new medicines, tell your health care provider.  Keep all follow-up visits as told by your  health care provider. This is important. ? As your condition improves, your dosage of thyroid hormone medicine may change. ? You will need to have blood tests regularly so that your health care provider can monitor your condition. Contact a health care provider if:  Your symptoms do not get better with treatment.  You are taking thyroid hormone replacement medicine and you: ? Sweat a  lot. ? Have tremors. ? Feel anxious. ? Lose weight rapidly. ? Cannot tolerate heat. ? Have emotional swings. ? Have diarrhea. ? Feel weak. Get help right away if you have:  Chest pain.  An irregular heartbeat.  A rapid heartbeat.  Difficulty breathing. Summary  Hypothyroidism is when the thyroid gland does not make enough of certain hormones (it is underactive).  When the thyroid is underactive, it produces too little of the hormones thyroxine (T4) and triiodothyronine (T3).  The most common cause is Hashimoto's disease, a disease in which the body's disease-fighting system (immune system) attacks the thyroid gland. The condition can also be caused by viral infections, medicine, pregnancy, or past radiation treatment to the head or neck.  Symptoms may include weight gain, dry skin, constipation, feeling as though you do not have energy, and not being able to tolerate cold.  This condition is treated with medicine to replace the thyroid hormones that your body does not make. This information is not intended to replace advice given to you by your health care provider. Make sure you discuss any questions you have with your health care provider. Document Revised: 10/16/2019 Document Reviewed: 10/01/2019 Elsevier Patient Education  2021 [place].  Hypertension, Adult High blood pressure (hypertension) is when the force of blood pumping through the arteries is too strong. The arteries are the blood vessels that carry blood from the heart throughout the body. Hypertension forces the heart to work harder to pump blood and may cause arteries to become narrow or stiff. Untreated or uncontrolled hypertension can cause a heart attack, heart failure, a stroke, kidney disease, and other problems. A blood pressure reading consists of a higher number over a lower number. Ideally, your blood pressure should be below 120/80. The first ("top") number is called the systolic pressure. It is a  measure of the pressure in your arteries as your heart beats. The second ("bottom") number is called the diastolic pressure. It is a measure of the pressure in your arteries as the heart relaxes. What are the causes? The exact cause of this condition is not known. There are some conditions that result in or are related to high blood pressure. What increases the risk? Some risk factors for high blood pressure are under your control. The following factors may make you more likely to develop this condition:  Smoking.  Having type 2 diabetes mellitus, high cholesterol, or both.  Not getting enough exercise or physical activity.  Being overweight.  Having too much fat, sugar, calories, or salt (sodium) in your diet.  Drinking too much alcohol. Some risk factors for high blood pressure may be difficult or impossible to change. Some of these factors include:  Having chronic kidney disease.  Having a family history of high blood pressure.  Age. Risk increases with age.  Race. You may be at higher risk if you are African American.  Gender. Men are at higher risk than women before age 44. After age 16, women are at higher risk than men.  Having obstructive sleep apnea.  Stress. What are the signs or symptoms? High blood pressure  may not cause symptoms. Very high blood pressure (hypertensive crisis) may cause:  Headache.  Anxiety.  Shortness of breath.  Nosebleed.  Nausea and vomiting.  Vision changes.  Severe chest pain.  Seizures. How is this diagnosed? This condition is diagnosed by measuring your blood pressure while you are seated, with your arm resting on a flat surface, your legs uncrossed, and your feet flat on the floor. The cuff of the blood pressure monitor will be placed directly against the skin of your upper arm at the level of your heart. It should be measured at least twice using the same arm. Certain conditions can cause a difference in blood pressure between  your right and left arms. Certain factors can cause blood pressure readings to be lower or higher than normal for a short period of time:  When your blood pressure is higher when you are in a health care provider's office than when you are at home, this is called white coat hypertension. Most people with this condition do not need medicines.  When your blood pressure is higher at home than when you are in a health care provider's office, this is called masked hypertension. Most people with this condition may need medicines to control blood pressure. If you have a high blood pressure reading during one visit or you have normal blood pressure with other risk factors, you may be asked to:  Return on a different day to have your blood pressure checked again.  Monitor your blood pressure at home for 1 week or longer. If you are diagnosed with hypertension, you may have other blood or imaging tests to help your health care provider understand your overall risk for other conditions. How is this treated? This condition is treated by making healthy lifestyle changes, such as eating healthy foods, exercising more, and reducing your alcohol intake. Your health care provider may prescribe medicine if lifestyle changes are not enough to get your blood pressure under control, and if:  Your systolic blood pressure is above 130.  Your diastolic blood pressure is above 80. Your personal target blood pressure may vary depending on your medical conditions, your age, and other factors. Follow these instructions at home: Eating and drinking  Eat a diet that is high in fiber and potassium, and low in sodium, added sugar, and fat. An example eating plan is called the DASH (Dietary Approaches to Stop Hypertension) diet. To eat this way: ? Eat plenty of fresh fruits and vegetables. Try to fill one half of your plate at each meal with fruits and vegetables. ? Eat whole grains, such as whole-wheat pasta, brown rice, or  whole-grain bread. Fill about one fourth of your plate with whole grains. ? Eat or drink low-fat dairy products, such as skim milk or low-fat yogurt. ? Avoid fatty cuts of meat, processed or cured meats, and poultry with skin. Fill about one fourth of your plate with lean proteins, such as fish, chicken without skin, beans, eggs, or tofu. ? Avoid pre-made and processed foods. These tend to be higher in sodium, added sugar, and fat.  Reduce your daily sodium intake. Most people with hypertension should eat less than 1,500 mg of sodium a day.  Do not drink alcohol if: ? Your health care provider tells you not to drink. ? You are pregnant, may be pregnant, or are planning to become pregnant.  If you drink alcohol: ? Limit how much you use to:  0-1 drink a day for women.  0-2 drinks  a day for men. ? Be aware of how much alcohol is in your drink. In the [place] one drink equals one 12 oz bottle of beer (355 mL), one 5 oz glass of wine (148 mL), or one 1 oz glass of hard liquor (44 mL).   Lifestyle  Work with your health care provider to maintain a healthy body weight or to lose weight. Ask what an ideal weight is for you.  Get at least 30 minutes of exercise most days of the week. Activities may include walking, swimming, or biking.  Include exercise to strengthen your muscles (resistance exercise), such as Pilates or lifting weights, as part of your weekly exercise routine. Try to do these types of exercises for 30 minutes at least 3 days a week.  Do not use any products that contain nicotine or tobacco, such as cigarettes, e-cigarettes, and chewing tobacco. If you need help quitting, ask your health care provider.  Monitor your blood pressure at home as told by your health care provider.  Keep all follow-up visits as told by your health care provider. This is important.   Medicines  Take over-the-counter and prescription medicines only as told by your health care provider. Follow  directions carefully. Blood pressure medicines must be taken as prescribed.  Do not skip doses of blood pressure medicine. Doing this puts you at risk for problems and can make the medicine less effective.  Ask your health care provider about side effects or reactions to medicines that you should watch for. Contact a health care provider if you:  Think you are having a reaction to a medicine you are taking.  Have headaches that keep coming back (recurring).  Feel dizzy.  Have swelling in your ankles.  Have trouble with your vision. Get help right away if you:  Develop a severe headache or confusion.  Have unusual weakness or numbness.  Feel faint.  Have severe pain in your chest or abdomen.  Vomit repeatedly.  Have trouble breathing. Summary  Hypertension is when the force of blood pumping through your arteries is too strong. If this condition is not controlled, it may put you at risk for serious complications.  Your personal target blood pressure may vary depending on your medical conditions, your age, and other factors. For most people, a normal blood pressure is less than 120/80.  Hypertension is treated with lifestyle changes, medicines, or a combination of both. Lifestyle changes include losing weight, eating a healthy, low-sodium diet, exercising more, and limiting alcohol. This information is not intended to replace advice given to you by your health care provider. Make sure you discuss any questions you have with your health care provider. Document Revised: 09/25/2017 Document Reviewed: 09/25/2017 Elsevier Patient Education  2021 [employer].

## 2020-04-11 NOTE — Progress Notes (Signed)
Steven Morales 55 y.o.   Chief Complaint  Patient presents with  . Hypertension    Follow up    HISTORY OF PRESENT ILLNESS: This is a 55 y.o. male with history of hypertension and hypothyroidism here for follow-up. Compliant with medications.  Has no complaints or medical concerns today.  Doing well.  HPI   Prior to Admission medications   Medication Sig Start Date End Date Taking? Authorizing Provider  cetirizine (ZYRTEC) 10 MG tablet  10/03/15  Yes [provider]  FLUTICASONE-SALMETEROL IN Inhale into the lungs as needed.   Yes [provider]  hydrochlorothiazide (HYDRODIURIL) 25 MG tablet  10/03/15  Yes [provider]  ibuprofen (ADVIL,MOTRIN) 600 MG tablet Take 1 tablet (600 mg total) by mouth every 6 (six) hours as needed. 08/17/16  Yes Cardama, Grayce Sessions, MD  levothyroxine (SYNTHROID) 200 MCG tablet Take 1 tablet (200 mcg total) by mouth daily before breakfast. 03/22/20  Yes Renato Shin, MD  lisinopril (ZESTRIL) 10 MG tablet Take 10 mg by mouth daily. 11/18/19  Yes [provider]  losartan (COZAAR) 50 MG tablet Take 2 tablets (100 mg total) by mouth daily. 02/17/20  Yes Turner, Eber Hong, MD  ROCKLATAN 0.02-0.005 % SOLN Apply 1 drop to eye daily. 02/17/20  Yes [provider]  sildenafil (VIAGRA) 100 MG tablet Take 0.5-1 tablets (50-100 mg total) by mouth daily as needed for erectile dysfunction. 01/11/20  Yes Ammar Moffatt, Ines Bloomer, MD  timolol (TIMOPTIC) 0.5 % ophthalmic solution Place 1 drop into both eyes every morning. 02/17/20  Yes [provider]  Cholecalciferol (VITAMIN D-3) 1000 UNITS CAPS Take 1 capsule by mouth daily. Reported on 08/09/2015    [provider]  Cholecalciferol (VITAMIN D3) 1.25 MG (50000 UT) CAPS Take 1 capsule by mouth once a week. 02/29/20   [provider]  clotrimazole-betamethasone (LOTRISONE) cream Apply 1 application topically 2 (two) times daily. 02/11/20   Posey Boyer, MD   fluticasone (FLONASE) 50 MCG/ACT nasal spray Place 2 sprays into both nostrils daily. Patient not taking: Reported on 04/11/2020 02/11/20   Posey Boyer, MD  furosemide (LASIX) 40 MG tablet Take 40 mg by mouth as needed. Patient not taking: Reported on 04/11/2020 10/03/15   [provider]    No Known Allergies  Patient Active Problem List   Diagnosis Date Noted  . Essential hypertension 07/29/2017  . Muscle pain 02/27/2017  . 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 Bilateral arm pain 02/27/2017  . Degenerative disc disease, lumbar 10/31/2015  . Bradycardia 10/31/2015  . Pain in right elbow 11/29/2014  . Hypothyroidism following radioiodine therapy 06/24/2014    Past Medical History:  Diagnosis Date  . Acute pharyngitis   . Allergy    seasonal allergies  . Anemia    hx of  . Bradycardia   . 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 Dry eye syndrome of both lacrimal glands   . Elevated blood pressure reading without diagnosis of hypertension   . Hyperlipidemia    on meds  . Hypertension    on meds  . Hypothyroidism    on meds  . Memory loss   . Vitamin A deficiency     Past Surgical History:  Procedure Laterality Date  . BACK SURGERY      Social History   Socioeconomic History  . Marital status: Divorced    Spouse name: Not on file  . Number of children: 6  . Years of education:  HS  . Highest education level: Not on file  Occupational History    Employer: [employer]    Comment: [place]  Tobacco Use  . Smoking status: Never Smoker  . Smokeless tobacco: Never Used  Substance and Sexual Activity  . Alcohol use: No  . Drug use: No  . Sexual activity: Not on file  Other Topics Concern  . Not on file  Social History Narrative   Patient lives at home with his spouse.   Caffeine Use: 2-3 cups daily   Social Determinants of Health   Financial Resource Strain: Not on file  Food Insecurity: Not on file  Transportation Needs: Not on  file  Physical Activity: Not on file  Stress: Not on file  Social Connections: Not on file  Intimate Partner Violence: Not on file    Family History  Problem Relation Age of Onset  . Hypertension Father   . Thyroid disease Father   . Diabetes Father   . Colon cancer Neg Hx   . Colon polyps Neg Hx   . Esophageal cancer Neg Hx   . Stomach cancer Neg Hx   . Rectal cancer Neg Hx      Review of Systems  Constitutional: Negative.  Negative for chills and fever.  HENT: Negative.  Negative for congestion and sore throat.   Respiratory: Negative.  Negative for cough and shortness of breath.   Cardiovascular: Negative.  Negative for chest pain and palpitations.  Gastrointestinal: Negative.  Negative for abdominal pain, diarrhea, nausea and vomiting.  Genitourinary: Negative.  Negative for dysuria and hematuria.  Musculoskeletal: Negative.  Negative for back pain, myalgias and neck pain.  Skin: Negative.  Negative for rash.  Neurological: Negative.  Negative for dizziness and headaches.  All other systems reviewed and are negative.   Today's Vitals   04/11/20 0918  BP: 134/79  Pulse: (!) 49  Resp: 16  Temp: 97.9 F (36.6 C)  TempSrc: Temporal  SpO2: 98%  Weight: 196 lb (88.9 kg)  Height: 6' (1.829 m)   Body mass index is 26.58 kg/m. Wt Readings from Last 3 Encounters:  04/11/20 196 lb (88.9 kg)  03/22/20 197 lb 12.8 oz (89.7 kg)  03/11/20 201 lb (91.2 kg)    Physical Exam Vitals reviewed.  Constitutional:      Appearance: Normal appearance.  HENT:     Head: Normocephalic.  Eyes:     Extraocular Movements: Extraocular movements intact.     Conjunctiva/sclera: Conjunctivae normal.     Pupils: Pupils are equal, round, and reactive to light.  Neck:     Vascular: No carotid bruit.  Cardiovascular:     Rate and Rhythm: Regular rhythm. Bradycardia present.     Pulses: Normal pulses.     Heart sounds: Normal heart sounds.  Pulmonary:     Effort: Pulmonary effort is  normal.     Breath sounds: Normal breath sounds.  Musculoskeletal:        General: Normal range of motion.     Cervical back: Normal range of motion and neck supple.  Lymphadenopathy:     Cervical: No cervical adenopathy.  Skin:    General: Skin is warm and dry.     Capillary Refill: Capillary refill takes less than 2 seconds.  Neurological:     General: No focal deficit present.     Mental Status: He is alert and oriented to person, place, and time.  Psychiatric:        Mood and  Affect: Mood normal.        Behavior: Behavior normal.    A total of 30 minutes was spent with the patient, greater than 50% of which was in counseling/coordination of care regarding hypertension and cardiovascular risks associated with this condition, review of all medications, review of most recent office visit notes, review of most recent blood work results, education on nutrition, health maintenance items, prognosis, documentation and need for follow-up.   ASSESSMENT & PLAN: Clinically stable.  No medical concerns identified during this visit. Continue present medications.  No changes. Follow-up in 6 months.  Abigail was seen today for hypertension.  Diagnoses and all orders for this visit:  Essential hypertension -     Lipid panel -     Comprehensive metabolic panel  Hypothyroidism following radioiodine therapy -     TSH  Bradycardia  Screening for colon cancer -     Ambulatory referral to Gastroenterology    Patient Instructions       If you have lab work done today you will be contacted with your lab results within the next 2 weeks.  If you have not heard from [place] then please contact us. The fastest way to get your results is to register for My Chart.   IF you received an x-ray today, you will receive an invoice from [facility]. Please contact [facility] at [phone] with questions or concerns regarding your invoice.   IF you received labwork today, you will  receive an invoice from [facility]. Please contact [facility] at [phone] with questions or concerns regarding your invoice.   Our billing staff will not be able to assist you with questions regarding bills from these companies.  You will be contacted with the lab results as soon as they are available. The fastest way to get your results is to activate your My Chart account. Instructions are located on the last page of this paperwork. If you have not heard from [place] regarding the results in 2 weeks, please contact this office.     Hypothyroidism  Hypothyroidism is when the thyroid gland does not make enough of certain hormones (it is underactive). The thyroid gland is a small gland located in the lower front part of the neck, just in front of the windpipe (trachea). This gland makes hormones that help control how the body uses food for energy (metabolism) as well as how the heart and brain function. These hormones also play a role in keeping your bones strong. When the thyroid is underactive, it produces too little of the hormones thyroxine (T4) and triiodothyronine (T3). What are the causes? This condition may be caused by:  Hashimoto's disease. This is a disease in which the body's disease-fighting system (immune system) attacks the thyroid gland. This is the most common cause.  Viral infections.  Pregnancy.  Certain medicines.  Birth defects.  Past radiation treatments to the head or neck for cancer.  Past treatment with radioactive iodine.  Past exposure to radiation in the environment.  Past surgical removal of part or all of the thyroid.  Problems with a gland in the center of the brain (pituitary gland).  Lack of enough iodine in the diet. What increases the risk? You are more likely to develop this condition if:  You are male.  You have a family history of thyroid conditions.  You use a medicine called lithium.  You take medicines that affect the immune system  (immunosuppressants). What are the signs or symptoms? Symptoms of this  condition include:  Feeling as though you have no energy (lethargy).  Not being able to tolerate cold.  Weight gain that is not explained by a change in diet or exercise habits.  Lack of appetite.  Dry skin.  Coarse hair.  Menstrual irregularity.  Slowing of thought processes.  Constipation.  Sadness or depression. How is this diagnosed? This condition may be diagnosed based on:  Your symptoms, your medical history, and a physical exam.  Blood tests. You may also have imaging tests, such as an ultrasound or MRI. How is this treated? This condition is treated with medicine that replaces the thyroid hormones that your body does not make. After you begin treatment, it may take several weeks for symptoms to go away. Follow these instructions at home:  Take over-the-counter and prescription medicines only as told by your health care provider.  If you start taking any new medicines, tell your health care provider.  Keep all follow-up visits as told by your health care provider. This is important. ? As your condition improves, your dosage of thyroid hormone medicine may change. ? You will need to have blood tests regularly so that your health care provider can monitor your condition. Contact a health care provider if:  Your symptoms do not get better with treatment.  You are taking thyroid hormone replacement medicine and you: ? Sweat a lot. ? Have tremors. ? Feel anxious. ? Lose weight rapidly. ? Cannot tolerate heat. ? Have emotional swings. ? Have diarrhea. ? Feel weak. Get help right away if you have:  Chest pain.  An irregular heartbeat.  A rapid heartbeat.  Difficulty breathing. Summary  Hypothyroidism is when the thyroid gland does not make enough of certain hormones (it is underactive).  When the thyroid is underactive, it produces too little of the hormones thyroxine (T4) and  triiodothyronine (T3).  The most common cause is Hashimoto's disease, a disease in which the body's disease-fighting system (immune system) attacks the thyroid gland. The condition can also be caused by viral infections, medicine, pregnancy, or past radiation treatment to the head or neck.  Symptoms may include weight gain, dry skin, constipation, feeling as though you do not have energy, and not being able to tolerate cold.  This condition is treated with medicine to replace the thyroid hormones that your body does not make. This information is not intended to replace advice given to you by your health care provider. Make sure you discuss any questions you have with your health care provider. Document Revised: 10/16/2019 Document Reviewed: 10/01/2019 Elsevier Patient Education  2021 [place].  Hypertension, Adult High blood pressure (hypertension) is when the force of blood pumping through the arteries is too strong. The arteries are the blood vessels that carry blood from the heart throughout the body. Hypertension forces the heart to work harder to pump blood and may cause arteries to become narrow or stiff. Untreated or uncontrolled hypertension can cause a heart attack, heart failure, a stroke, kidney disease, and other problems. A blood pressure reading consists of a higher number over a lower number. Ideally, your blood pressure should be below 120/80. The first ("top") number is called the systolic pressure. It is a measure of the pressure in your arteries as your heart beats. The second ("bottom") number is called the diastolic pressure. It is a measure of the pressure in your arteries as the heart relaxes. What are the causes? The exact cause of this condition is not known. There are some conditions  that result in or are related to high blood pressure. What increases the risk? Some risk factors for high blood pressure are under your control. The following factors may make you more  likely to develop this condition:  Smoking.  Having type 2 diabetes mellitus, high cholesterol, or both.  Not getting enough exercise or physical activity.  Being overweight.  Having too much fat, sugar, calories, or salt (sodium) in your diet.  Drinking too much alcohol. Some risk factors for high blood pressure may be difficult or impossible to change. Some of these factors include:  Having chronic kidney disease.  Having a family history of high blood pressure.  Age. Risk increases with age.  Race. You may be at higher risk if you are African American.  Gender. Men are at higher risk than women before age 23. After age 85, women are at higher risk than men.  Having obstructive sleep apnea.  Stress. What are the signs or symptoms? High blood pressure may not cause symptoms. Very high blood pressure (hypertensive crisis) may cause:  Headache.  Anxiety.  Shortness of breath.  Nosebleed.  Nausea and vomiting.  Vision changes.  Severe chest pain.  Seizures. How is this diagnosed? This condition is diagnosed by measuring your blood pressure while you are seated, with your arm resting on a flat surface, your legs uncrossed, and your feet flat on the floor. The cuff of the blood pressure monitor will be placed directly against the skin of your upper arm at the level of your heart. It should be measured at least twice using the same arm. Certain conditions can cause a difference in blood pressure between your right and left arms. Certain factors can cause blood pressure readings to be lower or higher than normal for a short period of time:  When your blood pressure is higher when you are in a health care provider's office than when you are at home, this is called white coat hypertension. Most people with this condition do not need medicines.  When your blood pressure is higher at home than when you are in a health care provider's office, this is called masked  hypertension. Most people with this condition may need medicines to control blood pressure. If you have a high blood pressure reading during one visit or you have normal blood pressure with other risk factors, you may be asked to:  Return on a different day to have your blood pressure checked again.  Monitor your blood pressure at home for 1 week or longer. If you are diagnosed with hypertension, you may have other blood or imaging tests to help your health care provider understand your overall risk for other conditions. How is this treated? This condition is treated by making healthy lifestyle changes, such as eating healthy foods, exercising more, and reducing your alcohol intake. Your health care provider may prescribe medicine if lifestyle changes are not enough to get your blood pressure under control, and if:  Your systolic blood pressure is above 130.  Your diastolic blood pressure is above 80. Your personal target blood pressure may vary depending on your medical conditions, your age, and other factors. Follow these instructions at home: Eating and drinking  Eat a diet that is high in fiber and potassium, and low in sodium, added sugar, and fat. An example eating plan is called the DASH (Dietary Approaches to Stop Hypertension) diet. To eat this way: ? Eat plenty of fresh fruits and vegetables. Try to fill one half of  your plate at each meal with fruits and vegetables. ? Eat whole grains, such as whole-wheat pasta, brown rice, or whole-grain bread. Fill about one fourth of your plate with whole grains. ? Eat or drink low-fat dairy products, such as skim milk or low-fat yogurt. ? Avoid fatty cuts of meat, processed or cured meats, and poultry with skin. Fill about one fourth of your plate with lean proteins, such as fish, chicken without skin, beans, eggs, or tofu. ? Avoid pre-made and processed foods. These tend to be higher in sodium, added sugar, and fat.  Reduce your daily sodium  intake. Most people with hypertension should eat less than 1,500 mg of sodium a day.  Do not drink alcohol if: ? Your health care provider tells you not to drink. ? You are pregnant, may be pregnant, or are planning to become pregnant.  If you drink alcohol: ? Limit how much you use to:  0-1 drink a day for women.  0-2 drinks a day for men. ? Be aware of how much alcohol is in your drink. In the [place] one drink equals one 12 oz bottle of beer (355 mL), one 5 oz glass of wine (148 mL), or one 1 oz glass of hard liquor (44 mL).   Lifestyle  Work with your health care provider to maintain a healthy body weight or to lose weight. Ask what an ideal weight is for you.  Get at least 30 minutes of exercise most days of the week. Activities may include walking, swimming, or biking.  Include exercise to strengthen your muscles (resistance exercise), such as Pilates or lifting weights, as part of your weekly exercise routine. Try to do these types of exercises for 30 minutes at least 3 days a week.  Do not use any products that contain nicotine or tobacco, such as cigarettes, e-cigarettes, and chewing tobacco. If you need help quitting, ask your health care provider.  Monitor your blood pressure at home as told by your health care provider.  Keep all follow-up visits as told by your health care provider. This is important.   Medicines  Take over-the-counter and prescription medicines only as told by your health care provider. Follow directions carefully. Blood pressure medicines must be taken as prescribed.  Do not skip doses of blood pressure medicine. Doing this puts you at risk for problems and can make the medicine less effective.  Ask your health care provider about side effects or reactions to medicines that you should watch for. Contact a health care provider if you:  Think you are having a reaction to a medicine you are taking.  Have headaches that keep coming back  (recurring).  Feel dizzy.  Have swelling in your ankles.  Have trouble with your vision. Get help right away if you:  Develop a severe headache or confusion.  Have unusual weakness or numbness.  Feel faint.  Have severe pain in your chest or abdomen.  Vomit repeatedly.  Have trouble breathing. Summary  Hypertension is when the force of blood pumping through your arteries is too strong. If this condition is not controlled, it may put you at risk for serious complications.  Your personal target blood pressure may vary depending on your medical conditions, your age, and other factors. For most people, a normal blood pressure is less than 120/80.  Hypertension is treated with lifestyle changes, medicines, or a combination of both. Lifestyle changes include losing weight, eating a healthy, low-sodium diet, exercising more, and limiting alcohol. This  information is not intended to replace advice given to you by your health care provider. Make sure you discuss any questions you have with your health care provider. Document Revised: 09/25/2017 Document Reviewed: 09/25/2017 Elsevier Patient Education  2021 Elsevier Inc.      Agustina Caroli, MD [facility]

## 2020-04-11 NOTE — Assessment & Plan Note (Signed)
Well-controlled hypertension.  Continue losartan and hydrochlorothiazide. Follow-up in 6 months.

## 2020-04-12 LAB — LIPID PANEL
Chol/HDL Ratio: 2.9 ratio (ref 0.0–5.0)
Cholesterol, Total: 151 mg/dL (ref 100–199)
HDL: 52 mg/dL (ref >39)
LDL Chol Calc (NIH): 88 mg/dL (ref 0–99)
Triglycerides: 53 mg/dL (ref 0–149)
VLDL Cholesterol Cal: 11 mg/dL (ref 5–40)

## 2020-04-12 LAB — COMPREHENSIVE METABOLIC PANEL
ALT: 15 IU/L (ref 0–44)
AST: 21 IU/L (ref 0–40)
Albumin/Globulin Ratio: 1.8 (ref 1.2–2.2)
Albumin: 4.6 g/dL (ref 3.8–4.9)
Alkaline Phosphatase: 42 IU/L — ABNORMAL LOW (ref 44–121)
BUN/Creatinine Ratio: 17 (ref 9–20)
BUN: 17 mg/dL (ref 6–24)
Bilirubin Total: 0.9 mg/dL (ref 0.0–1.2)
CO2: 25 mmol/L (ref 20–29)
Calcium: 9.8 mg/dL (ref 8.7–10.2)
Chloride: 101 mmol/L (ref 96–106)
Creatinine, Ser: 0.99 mg/dL (ref 0.76–1.27)
Globulin, Total: 2.5 g/dL (ref 1.5–4.5)
Glucose: 100 mg/dL — ABNORMAL HIGH (ref 65–99)
Potassium: 4.4 mmol/L (ref 3.5–5.2)
Sodium: 139 mmol/L (ref 134–144)
Total Protein: 7.1 g/dL (ref 6.0–8.5)
eGFR: 89 mL/min/{1.73_m2} (ref >59)

## 2020-04-12 LAB — TSH: TSH: 7.64 u[IU]/mL — ABNORMAL HIGH (ref 0.450–4.500)

## 2020-04-13 DIAGNOSIS — H04123 Dry eye syndrome of bilateral lacrimal glands: Secondary | ICD-10-CM | POA: Diagnosis not present

## 2020-04-13 DIAGNOSIS — H401133 Primary open-angle glaucoma, bilateral, severe stage: Secondary | ICD-10-CM | POA: Diagnosis not present

## 2020-04-13 DIAGNOSIS — H2000 Unspecified acute and subacute iridocyclitis: Secondary | ICD-10-CM | POA: Diagnosis not present

## 2020-04-18 DIAGNOSIS — H04123 Dry eye syndrome of bilateral lacrimal glands: Secondary | ICD-10-CM | POA: Diagnosis not present

## 2020-04-18 DIAGNOSIS — H0102A Squamous blepharitis right eye, upper and lower eyelids: Secondary | ICD-10-CM | POA: Diagnosis not present

## 2020-04-18 DIAGNOSIS — H401133 Primary open-angle glaucoma, bilateral, severe stage: Secondary | ICD-10-CM | POA: Diagnosis not present

## 2020-04-18 DIAGNOSIS — H2000 Unspecified acute and subacute iridocyclitis: Secondary | ICD-10-CM | POA: Diagnosis not present

## 2020-04-26 DIAGNOSIS — H0102B Squamous blepharitis left eye, upper and lower eyelids: Secondary | ICD-10-CM | POA: Diagnosis not present

## 2020-04-26 DIAGNOSIS — H2000 Unspecified acute and subacute iridocyclitis: Secondary | ICD-10-CM | POA: Diagnosis not present

## 2020-04-26 DIAGNOSIS — H0102A Squamous blepharitis right eye, upper and lower eyelids: Secondary | ICD-10-CM | POA: Diagnosis not present

## 2020-04-26 DIAGNOSIS — H401133 Primary open-angle glaucoma, bilateral, severe stage: Secondary | ICD-10-CM | POA: Diagnosis not present

## 2020-05-03 ENCOUNTER — Other Ambulatory Visit: Payer: Self-pay

## 2020-05-03 ENCOUNTER — Ambulatory Visit: Payer: BC Managed Care – PPO | Admitting: Endocrinology

## 2020-05-03 VITALS — BP 130/64 | HR 67 | Ht 73.0 in | Wt 199.8 lb

## 2020-05-03 DIAGNOSIS — E890 Postprocedural hypothyroidism: Secondary | ICD-10-CM

## 2020-05-03 LAB — TSH: TSH: 3.97 u[IU]/mL (ref 0.35–4.50)

## 2020-05-03 LAB — T4, FREE: Free T4: 1.18 ng/dL (ref 0.60–1.60)

## 2020-05-03 NOTE — Patient Instructions (Addendum)
Blood tests are requested for you today.  We'll let you know about the results.  It is best to never miss the medication.  However, if you do miss it, next best is to double up the next time. Please come back for a follow-up appointment in 3 months.       

## 2020-05-03 NOTE — Progress Notes (Signed)
Subjective:    Patient ID: Steven Morales, male    DOB: 09-Nov-1965, 55 y.o.   MRN: [id number]  HPI Pt returns for f/u of post-RAI hypothyroidism (he had RAI for hyperthyroidism, in 2013 (scan showed diffuse goiter); he has been on synthroid since then).  Since synthroid was increased, pt states he feels no different, and well in general.  Pt says he never misses the synthroid.  He takes with water only.   Past Medical History:  Diagnosis Date  . Acute pharyngitis   . Allergy    seasonal allergies  . Anemia    hx of  . Bradycardia   . 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 Dry eye syndrome of both lacrimal glands   . Elevated blood pressure reading without diagnosis of hypertension   . Hyperlipidemia    on meds  . Hypertension    on meds  . Hypothyroidism    on meds  . Memory loss   . Vitamin A deficiency     Past Surgical History:  Procedure Laterality Date  . BACK SURGERY      Social History   Socioeconomic History  . Marital status: Divorced    Spouse name: Not on file  . Number of children: 6  . Years of education: HS  . Highest education level: Not on file  Occupational History    Employer: [employer]    Comment: [place]  Tobacco Use  . Smoking status: Never Smoker  . Smokeless tobacco: Never Used  Substance and Sexual Activity  . Alcohol use: No  . Drug use: No  . Sexual activity: Not on file  Other Topics Concern  . Not on file  Social History Narrative   Patient lives at home with his spouse.   Caffeine Use: 2-3 cups daily   Social Determinants of Health   Financial Resource Strain: Not on file  Food Insecurity: Not on file  Transportation Needs: Not on file  Physical Activity: Not on file  Stress: Not on file  Social Connections: Not on file  Intimate Partner Violence: Not on file    Current Outpatient Medications on File Prior to Visit  Medication Sig Dispense Refill  . cetirizine (ZYRTEC) 10 MG tablet     .  Cholecalciferol (VITAMIN D-3) 1000 UNITS CAPS Take 1 capsule by mouth daily. Reported on 08/09/2015    . Cholecalciferol (VITAMIN D3) 1.25 MG (50000 UT) CAPS Take 1 capsule by mouth once a week.    . clotrimazole-betamethasone (LOTRISONE) cream Apply 1 application topically 2 (two) times daily. 30 g 2  . fluticasone (FLONASE) 50 MCG/ACT nasal spray Place 2 sprays into both nostrils daily. 16 g 6  . FLUTICASONE-SALMETEROL IN Inhale into the lungs as needed.    . furosemide (LASIX) 40 MG tablet Take 40 mg by mouth as needed.    . hydrochlorothiazide (HYDRODIURIL) 25 MG tablet     . ibuprofen (ADVIL,MOTRIN) 600 MG tablet Take 1 tablet (600 mg total) by mouth every 6 (six) hours as needed. 30 tablet 0  . levothyroxine (SYNTHROID) 200 MCG tablet Take 1 tablet (200 mcg total) by mouth daily before breakfast. 90 tablet 3  . losartan (COZAAR) 50 MG tablet Take 2 tablets (100 mg total) by mouth daily. 180 tablet 3  . ROCKLATAN 0.02-0.005 % SOLN Apply 1 drop to eye daily.    . sildenafil (VIAGRA) 100 MG tablet Take 0.5-1 tablets (50-100 mg total) by mouth daily as needed  for erectile dysfunction. 5 tablet 11  . timolol (TIMOPTIC) 0.5 % ophthalmic solution Place 1 drop into both eyes every morning.     No current facility-administered medications on file prior to visit.    No Known Allergies  Family History  Problem Relation Age of Onset  . Hypertension Father   . Thyroid disease Father   . Diabetes Father   . Colon cancer Neg Hx   . Colon polyps Neg Hx   . Esophageal cancer Neg Hx   . Stomach cancer Neg Hx   . Rectal cancer Neg Hx     BP 130/64 (BP Location: Right Arm, Patient Position: Sitting, Cuff Size: Large)   Pulse 67   Ht 6\' 1"  (1.854 m)   Wt 199 lb 12.8 oz (90.6 kg)   SpO2 97%   BMI 26.36 kg/m   Review of Systems     Objective:   Physical Exam VITAL SIGNS:  See vs page GENERAL: no distress NECK: There is no palpable thyroid enlargement.  No thyroid nodule is palpable.  No  palpable lymphadenopathy at the anterior neck.   Lab Results  Component Value Date   TSH 3.97 05/03/2020       Assessment & Plan:  Hypothyroidism: well-replaced.  Please continue the same synthroid.

## 2020-06-28 ENCOUNTER — Ambulatory Visit: Payer: Self-pay

## 2020-06-28 ENCOUNTER — Other Ambulatory Visit: Payer: Self-pay | Admitting: Family Medicine

## 2020-06-28 ENCOUNTER — Other Ambulatory Visit: Payer: Self-pay

## 2020-06-28 DIAGNOSIS — M545 Low back pain, unspecified: Secondary | ICD-10-CM

## 2020-07-18 DIAGNOSIS — M4726 Other spondylosis with radiculopathy, lumbar region: Secondary | ICD-10-CM | POA: Diagnosis not present

## 2020-07-18 DIAGNOSIS — M48061 Spinal stenosis, lumbar region without neurogenic claudication: Secondary | ICD-10-CM | POA: Diagnosis not present

## 2020-07-18 DIAGNOSIS — M5116 Intervertebral disc disorders with radiculopathy, lumbar region: Secondary | ICD-10-CM | POA: Diagnosis not present

## 2020-07-18 DIAGNOSIS — M4807 Spinal stenosis, lumbosacral region: Secondary | ICD-10-CM | POA: Diagnosis not present

## 2020-07-21 DIAGNOSIS — Z20828 Contact with and (suspected) exposure to other viral communicable diseases: Secondary | ICD-10-CM | POA: Diagnosis not present

## 2020-07-22 DIAGNOSIS — Z03818 Encounter for observation for suspected exposure to other biological agents ruled out: Secondary | ICD-10-CM | POA: Diagnosis not present

## 2020-07-23 DIAGNOSIS — Z03818 Encounter for observation for suspected exposure to other biological agents ruled out: Secondary | ICD-10-CM | POA: Diagnosis not present

## 2020-08-02 ENCOUNTER — Ambulatory Visit: Payer: BC Managed Care – PPO | Admitting: Endocrinology

## 2020-09-19 ENCOUNTER — Telehealth: Payer: Self-pay | Admitting: Endocrinology

## 2020-09-19 ENCOUNTER — Other Ambulatory Visit: Payer: Self-pay

## 2020-09-19 ENCOUNTER — Encounter: Payer: Self-pay | Admitting: Endocrinology

## 2020-09-19 ENCOUNTER — Ambulatory Visit (INDEPENDENT_AMBULATORY_CARE_PROVIDER_SITE_OTHER): Payer: BC Managed Care – PPO | Admitting: Endocrinology

## 2020-09-19 VITALS — BP 170/98 | HR 48 | Ht 72.0 in | Wt 202.0 lb

## 2020-09-19 DIAGNOSIS — E890 Postprocedural hypothyroidism: Secondary | ICD-10-CM

## 2020-09-19 LAB — T4, FREE: Free T4: 0.07 ng/dL — ABNORMAL LOW (ref 0.60–1.60)

## 2020-09-19 LAB — TSH: TSH: 67.46 u[IU]/mL — ABNORMAL HIGH (ref 0.35–5.50)

## 2020-09-19 MED ORDER — LEVOTHYROXINE SODIUM 200 MCG PO TABS: 200.0000 ug | ORAL_TABLET | Freq: Every day | ORAL | 3 refills | Status: DC

## 2020-09-19 NOTE — Telephone Encounter (Signed)
Pt is needing a Medication refill for   levothyroxine (SYNTHROID) 200 MCG tablet   Pt is at front desk with concerns of  experiencing. Sore throat, face swelling, voice change. States that this maybe related to his thyroid.    [place], [place].

## 2020-09-19 NOTE — Progress Notes (Signed)
Subjective:    Patient ID: Steven Morales, male    DOB: 05-26-1965, 55 y.o.   MRN: [id number]  HPI Pt returns for f/u of post-RAI hypothyroidism (he had RAI for hyperthyroidism, in 2013 (scan showed diffuse goiter); he has been on synthroid since then).  Pt says he ran out of synthroid 1 month ago.  Since then, he has hoarseness.   Past Medical History:  Diagnosis Date   Acute pharyngitis    Allergy    seasonal allergies   Anemia    hx of   Bradycardia    Deficiency of other specified B group vitamins    Dry eye syndrome of both lacrimal glands    Elevated blood pressure reading without diagnosis of hypertension    Hyperlipidemia    on meds   Hypertension    on meds   Hypothyroidism    on meds   Memory loss    Vitamin A deficiency     Past Surgical History:  Procedure Laterality Date   BACK SURGERY      Social History   Socioeconomic History   Marital status: Divorced    Spouse name: Not on file   Number of children: 6   Years of education: HS   Highest education level: Not on file  Occupational History    Employer: [employer]    Comment: [place]  Tobacco Use   Smoking status: Never   Smokeless tobacco: Never  Substance and Sexual Activity   Alcohol use: No   Drug use: No   Sexual activity: Not on file  Other Topics Concern   Not on file  Social History Narrative   Patient lives at home with his spouse.   Caffeine Use: 2-3 cups daily   Social Determinants of Health   Financial Resource Strain: Not on file  Food Insecurity: Not on file  Transportation Needs: Not on file  Physical Activity: Not on file  Stress: Not on file  Social Connections: Not on file  Intimate Partner Violence: Not on file    Current Outpatient Medications on File Prior to Visit  Medication Sig Dispense Refill   cetirizine (ZYRTEC) 10 MG tablet      Cholecalciferol (VITAMIN D-3) 1000 UNITS CAPS Take 1 capsule by mouth daily. Reported on 08/09/2015      Cholecalciferol (VITAMIN D3) 1.25 MG (50000 UT) CAPS Take 1 capsule by mouth once a week.     clotrimazole-betamethasone (LOTRISONE) cream Apply 1 application topically 2 (two) times daily. 30 g 2   fluticasone (FLONASE) 50 MCG/ACT nasal spray Place 2 sprays into both nostrils daily. 16 g 6   FLUTICASONE-SALMETEROL IN Inhale into the lungs as needed.     furosemide (LASIX) 40 MG tablet Take 40 mg by mouth as needed.     hydrochlorothiazide (HYDRODIURIL) 25 MG tablet      ibuprofen (ADVIL,MOTRIN) 600 MG tablet Take 1 tablet (600 mg total) by mouth every 6 (six) hours as needed. 30 tablet 0   losartan (COZAAR) 50 MG tablet Take 2 tablets (100 mg total) by mouth daily. 180 tablet 3   ROCKLATAN 0.02-0.005 % SOLN Apply 1 drop to eye daily.     sildenafil (VIAGRA) 100 MG tablet Take 0.5-1 tablets (50-100 mg total) by mouth daily as needed for erectile dysfunction. 5 tablet 11   timolol (TIMOPTIC) 0.5 % ophthalmic solution Place 1 drop into both eyes every morning.     No current facility-administered medications on file prior to visit.  No Known Allergies  Family History  Problem Relation Age of Onset   Hypertension Father    Thyroid disease Father    Diabetes Father    Colon cancer Neg Hx    Colon polyps Neg Hx    Esophageal cancer Neg Hx    Stomach cancer Neg Hx    Rectal cancer Neg Hx     BP (!) 170/98   Pulse (!) 48   Ht 6' (1.829 m)   Wt 202 lb (91.6 kg)   SpO2 98%   BMI 27.40 kg/m    Review of Systems     Objective:   Physical Exam VITAL SIGNS:  See vs page GENERAL: no distress NECK: There is no palpable thyroid enlargement.  No thyroid nodule is palpable.  No palpable lymphadenopathy at the anterior neck.    Lab Results  Component Value Date   TSH 67.46 (H) 09/19/2020      Assessment & Plan:  Hypothyroidism: uncontrolled, due to noncompliance.  We discussed.  I have sent a prescription to your pharmacy, to resume.

## 2020-09-19 NOTE — Telephone Encounter (Signed)
Pt is stating that he is at the pharmacy and pharmacy does not have the prescription    [place], [place].

## 2020-09-19 NOTE — Telephone Encounter (Signed)
Pt is seeing Loanne Drilling today

## 2020-09-19 NOTE — Patient Instructions (Addendum)
Your blood pressure is high today.  Please see your primary care provider soon, to have it rechecked.   Blood tests are requested for you today.  We'll let you know about the results.   It is best to never miss the levothyroxine.  However, if you do miss it, next best is to double up the next time.   Please come back for a follow-up appointment in 6 weeks.    Votre tension artrielle est leve aujourd'hui. Veuillez consulter rapidement votre fournisseur de soins primaires pour [employer]. Des analyses de sang sont demandes pour vous aujourd'hui. Nous vous informerons des rsultats. Il est prfrable de ne jamais manquer la lvothyroxine. Cependant, si vous le manquez, le mieux est de doubler la prochaine fois. Veuillez revenir pour un rendez-vous de suivi dans 6 semaines.

## 2020-11-04 ENCOUNTER — Ambulatory Visit: Payer: BC Managed Care – PPO | Admitting: Endocrinology

## 2020-11-09 ENCOUNTER — Other Ambulatory Visit: Payer: Self-pay

## 2020-11-09 ENCOUNTER — Ambulatory Visit: Payer: BC Managed Care – PPO | Admitting: Endocrinology

## 2020-11-09 VITALS — BP 120/60 | HR 60 | Ht 72.0 in | Wt 202.8 lb

## 2020-11-09 DIAGNOSIS — E890 Postprocedural hypothyroidism: Secondary | ICD-10-CM

## 2020-11-09 NOTE — Patient Instructions (Addendum)
Blood tests are requested for you today.  We'll let you know about the results.   It is best to never miss the levothyroxine.  However, if you do miss it, next best is to double up the next time.   Please come back for a follow-up appointment in 3 months.  Des analyses de sang sont demandes pour vous aujourd'hui. Nous vous informerons des rsultats. Il est prfrable de ne jamais manquer la lvothyroxine. Cependant, si vous le manquez, le mieux est de doubler la prochaine fois. Veuillez revenir pour un rendez-vous de suivi dans 3 mois.

## 2020-11-09 NOTE — Progress Notes (Signed)
Subjective:    Patient ID: Steven Morales, male    DOB: 12/03/1965, 55 y.o.   MRN: [id number]  HPI Pt returns for f/u of post-RAI hypothyroidism (he had RAI for hyperthyroidism, in 2013 (scan showed diffuse goiter); he has been on synthroid since then).  Since back on synthroid, pt states he feels better in general. Past Medical History:  Diagnosis Date   Acute pharyngitis    Allergy    seasonal allergies   Anemia    hx of   Bradycardia    Deficiency of other specified B group vitamins    Dry eye syndrome of both lacrimal glands    Elevated blood pressure reading without diagnosis of hypertension    Hyperlipidemia    on meds   Hypertension    on meds   Hypothyroidism    on meds   Memory loss    Vitamin A deficiency     Past Surgical History:  Procedure Laterality Date   BACK SURGERY      Social History   Socioeconomic History   Marital status: Divorced    Spouse name: Not on file   Number of children: 6   Years of education: HS   Highest education level: Not on file  Occupational History    Employer: [employer]    Comment: [place]  Tobacco Use   Smoking status: Never   Smokeless tobacco: Never  Substance and Sexual Activity   Alcohol use: No   Drug use: No   Sexual activity: Not on file  Other Topics Concern   Not on file  Social History Narrative   Patient lives at home with his spouse.   Caffeine Use: 2-3 cups daily   Social Determinants of Health   Financial Resource Strain: Not on file  Food Insecurity: Not on file  Transportation Needs: Not on file  Physical Activity: Not on file  Stress: Not on file  Social Connections: Not on file  Intimate Partner Violence: Not on file    Current Outpatient Medications on File Prior to Visit  Medication Sig Dispense Refill   cetirizine (ZYRTEC) 10 MG tablet      Cholecalciferol (VITAMIN D-3) 1000 UNITS CAPS Take 1 capsule by mouth daily. Reported on 08/09/2015     Cholecalciferol (VITAMIN  D3) 1.25 MG (50000 UT) CAPS Take 1 capsule by mouth once a week.     clotrimazole-betamethasone (LOTRISONE) cream Apply 1 application topically 2 (two) times daily. 30 g 2   fluticasone (FLONASE) 50 MCG/ACT nasal spray Place 2 sprays into both nostrils daily. 16 g 6   FLUTICASONE-SALMETEROL IN Inhale into the lungs as needed.     furosemide (LASIX) 40 MG tablet Take 40 mg by mouth as needed.     hydrochlorothiazide (HYDRODIURIL) 25 MG tablet      ibuprofen (ADVIL,MOTRIN) 600 MG tablet Take 1 tablet (600 mg total) by mouth every 6 (six) hours as needed. 30 tablet 0   losartan (COZAAR) 50 MG tablet Take 2 tablets (100 mg total) by mouth daily. 180 tablet 3   ROCKLATAN 0.02-0.005 % SOLN Apply 1 drop to eye daily.     sildenafil (VIAGRA) 100 MG tablet Take 0.5-1 tablets (50-100 mg total) by mouth daily as needed for erectile dysfunction. 5 tablet 11   timolol (TIMOPTIC) 0.5 % ophthalmic solution Place 1 drop into both eyes every morning.     No current facility-administered medications on file prior to visit.    No Known Allergies  Family History  Problem Relation Age of Onset   Hypertension Father    Thyroid disease Father    Diabetes Father    Colon cancer Neg Hx    Colon polyps Neg Hx    Esophageal cancer Neg Hx    Stomach cancer Neg Hx    Rectal cancer Neg Hx     BP 120/60 (BP Location: Right Arm, Patient Position: Sitting, Cuff Size: Normal)   Pulse 60   Ht 6' (1.829 m)   Wt 202 lb 12.8 oz (92 kg)   SpO2 97%   BMI 27.50 kg/m     Review of Systems     Objective:   Physical Exam NECK: There is no palpable thyroid enlargement.  No thyroid nodule is palpable.  No palpable lymphadenopathy at the anterior neck.   Lab Results  Component Value Date   TSH 1.94 11/09/2020       Assessment & Plan:  Hypothyroidism: well-controlled.  Please continue the same synthroid

## 2020-11-10 ENCOUNTER — Telehealth: Payer: Self-pay

## 2020-11-10 LAB — T4, FREE: Free T4: 1.25 ng/dL (ref 0.60–1.60)

## 2020-11-10 LAB — TSH: TSH: 1.94 u[IU]/mL (ref 0.35–5.50)

## 2020-11-10 NOTE — Telephone Encounter (Signed)
Spoke with pt  regarding lab results/recommendations

## 2020-11-12 MED ORDER — LEVOTHYROXINE SODIUM 200 MCG PO TABS: 200.0000 ug | ORAL_TABLET | Freq: Every day | ORAL | 3 refills | Status: DC

## 2020-12-07 ENCOUNTER — Other Ambulatory Visit: Payer: Self-pay | Admitting: Emergency Medicine

## 2020-12-07 DIAGNOSIS — N529 Male erectile dysfunction, unspecified: Secondary | ICD-10-CM

## 2021-02-14 ENCOUNTER — Ambulatory Visit: Payer: BC Managed Care – PPO | Admitting: Endocrinology

## 2021-05-03 DIAGNOSIS — H04203 Unspecified epiphora, bilateral lacrimal glands: Secondary | ICD-10-CM | POA: Diagnosis not present

## 2021-05-03 DIAGNOSIS — H0102A Squamous blepharitis right eye, upper and lower eyelids: Secondary | ICD-10-CM | POA: Diagnosis not present

## 2021-05-03 DIAGNOSIS — H401133 Primary open-angle glaucoma, bilateral, severe stage: Secondary | ICD-10-CM | POA: Diagnosis not present

## 2021-05-03 DIAGNOSIS — H0288B Meibomian gland dysfunction left eye, upper and lower eyelids: Secondary | ICD-10-CM | POA: Diagnosis not present

## 2021-06-28 DIAGNOSIS — H11823 Conjunctivochalasis, bilateral: Secondary | ICD-10-CM | POA: Diagnosis not present

## 2021-06-28 DIAGNOSIS — H04223 Epiphora due to insufficient drainage, bilateral lacrimal glands: Secondary | ICD-10-CM | POA: Diagnosis not present

## 2021-06-28 DIAGNOSIS — H04563 Stenosis of bilateral lacrimal punctum: Secondary | ICD-10-CM | POA: Diagnosis not present

## 2021-07-25 DIAGNOSIS — H04563 Stenosis of bilateral lacrimal punctum: Secondary | ICD-10-CM | POA: Diagnosis not present

## 2021-07-25 DIAGNOSIS — H16213 Exposure keratoconjunctivitis, bilateral: Secondary | ICD-10-CM | POA: Diagnosis not present

## 2021-07-25 DIAGNOSIS — H11823 Conjunctivochalasis, bilateral: Secondary | ICD-10-CM | POA: Diagnosis not present

## 2021-07-25 DIAGNOSIS — H04223 Epiphora due to insufficient drainage, bilateral lacrimal glands: Secondary | ICD-10-CM | POA: Diagnosis not present

## 2021-08-29 ENCOUNTER — Other Ambulatory Visit: Payer: Self-pay | Admitting: Emergency Medicine

## 2021-08-29 DIAGNOSIS — N529 Male erectile dysfunction, unspecified: Secondary | ICD-10-CM

## 2021-10-31 DIAGNOSIS — Z13220 Encounter for screening for lipoid disorders: Secondary | ICD-10-CM | POA: Diagnosis not present

## 2021-10-31 DIAGNOSIS — Z131 Encounter for screening for diabetes mellitus: Secondary | ICD-10-CM | POA: Diagnosis not present

## 2021-10-31 DIAGNOSIS — K219 Gastro-esophageal reflux disease without esophagitis: Secondary | ICD-10-CM | POA: Diagnosis not present

## 2021-10-31 DIAGNOSIS — E039 Hypothyroidism, unspecified: Secondary | ICD-10-CM | POA: Diagnosis not present

## 2021-10-31 DIAGNOSIS — I10 Essential (primary) hypertension: Secondary | ICD-10-CM | POA: Diagnosis not present

## 2021-11-23 DIAGNOSIS — H0279 Other degenerative disorders of eyelid and periocular area: Secondary | ICD-10-CM | POA: Diagnosis not present

## 2021-11-23 DIAGNOSIS — H401110 Primary open-angle glaucoma, right eye, stage unspecified: Secondary | ICD-10-CM | POA: Diagnosis not present

## 2021-11-23 DIAGNOSIS — H04223 Epiphora due to insufficient drainage, bilateral lacrimal glands: Secondary | ICD-10-CM | POA: Diagnosis not present

## 2021-11-23 DIAGNOSIS — H401120 Primary open-angle glaucoma, left eye, stage unspecified: Secondary | ICD-10-CM | POA: Diagnosis not present

## 2021-11-29 ENCOUNTER — Other Ambulatory Visit: Payer: Self-pay | Admitting: Emergency Medicine

## 2021-11-29 DIAGNOSIS — N529 Male erectile dysfunction, unspecified: Secondary | ICD-10-CM

## 2022-10-28 IMAGING — DX DG LUMBAR SPINE COMPLETE 4+V
5 series · 5 of 5 positions shown · non-contrast
Comparison: March 17, 2012.

CLINICAL DATA: Lower back pain after fall last week.

EXAM:
LUMBAR SPINE - COMPLETE 4+ VIEW

[l-spine ap]
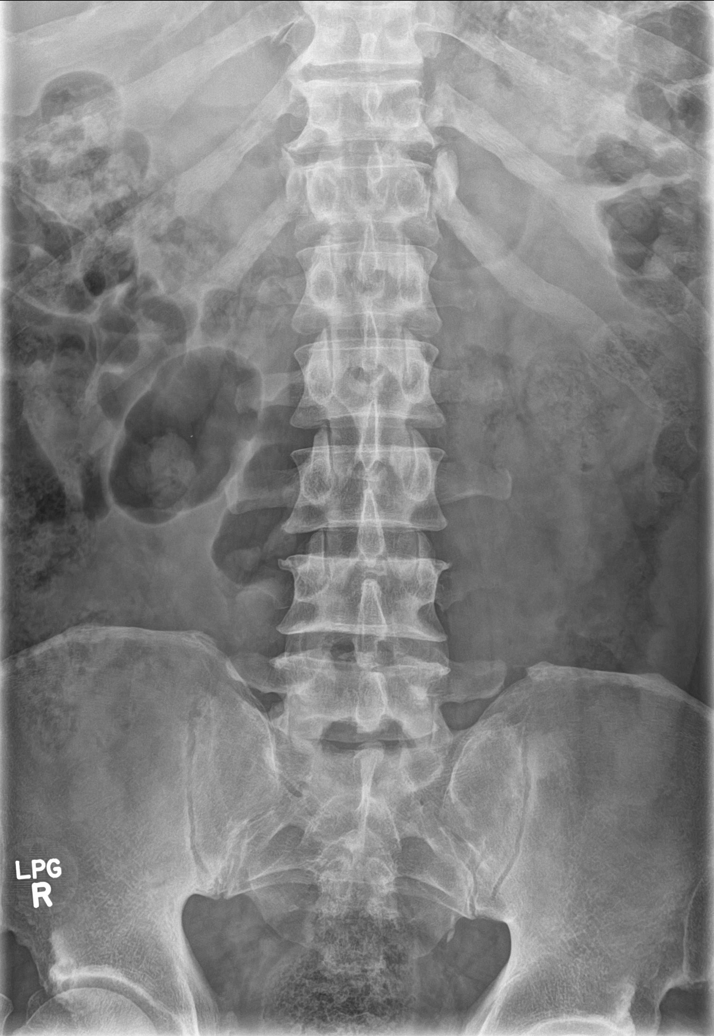

[l-spine obl (1 of 2)]
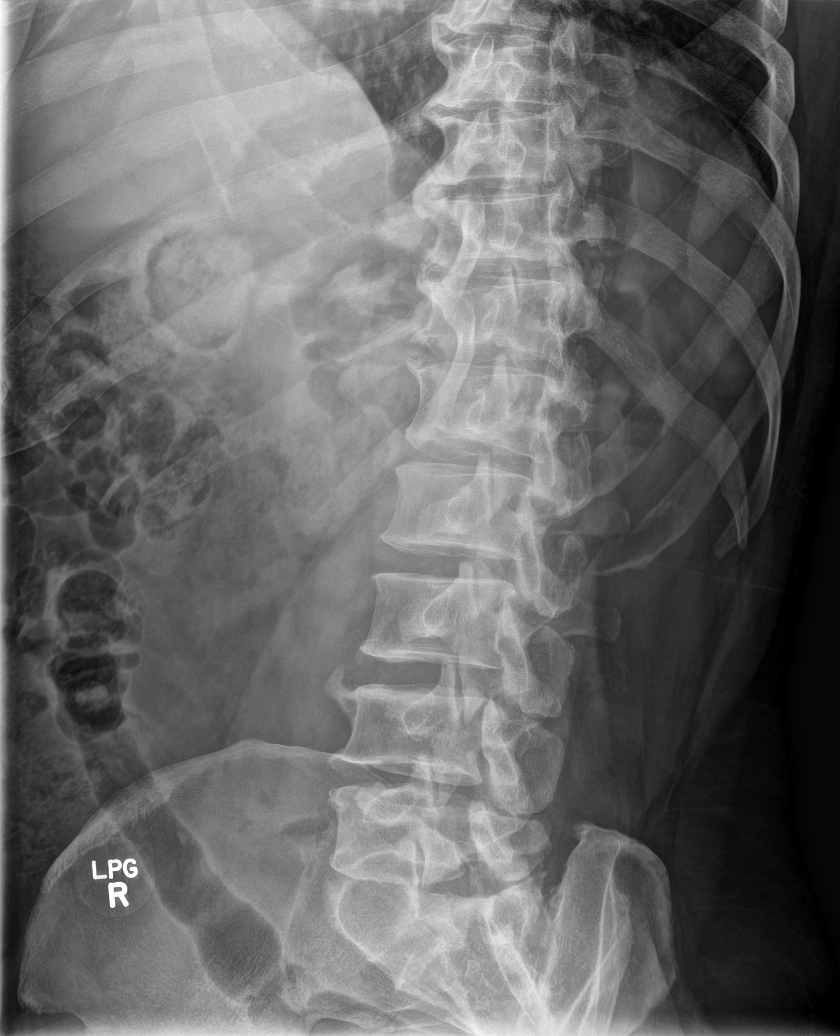

[l-spine obl (2 of 2)]
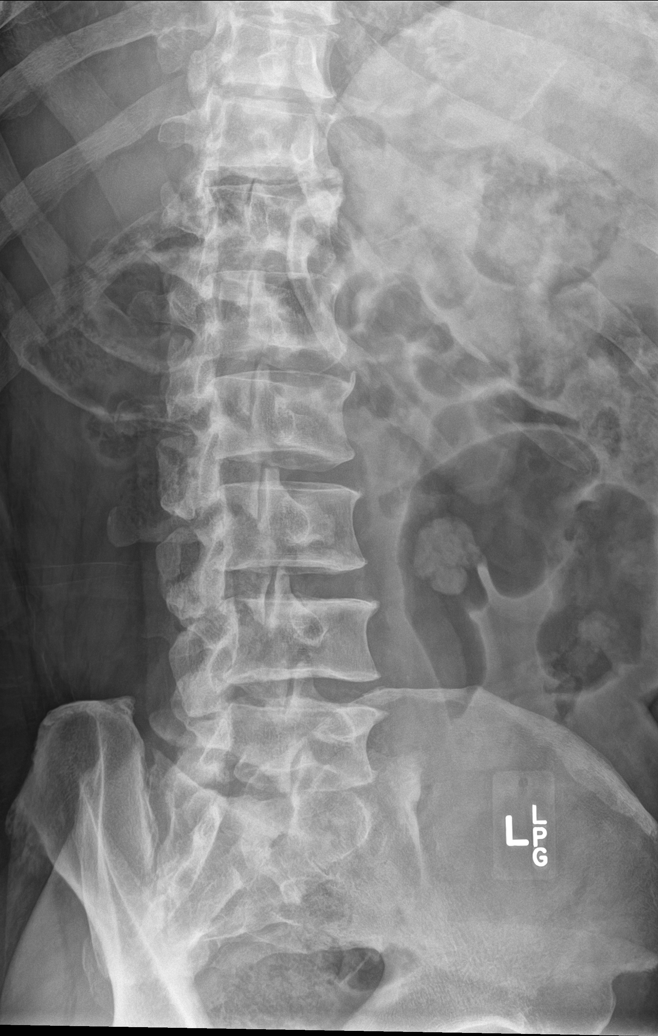

[l-spine lat]
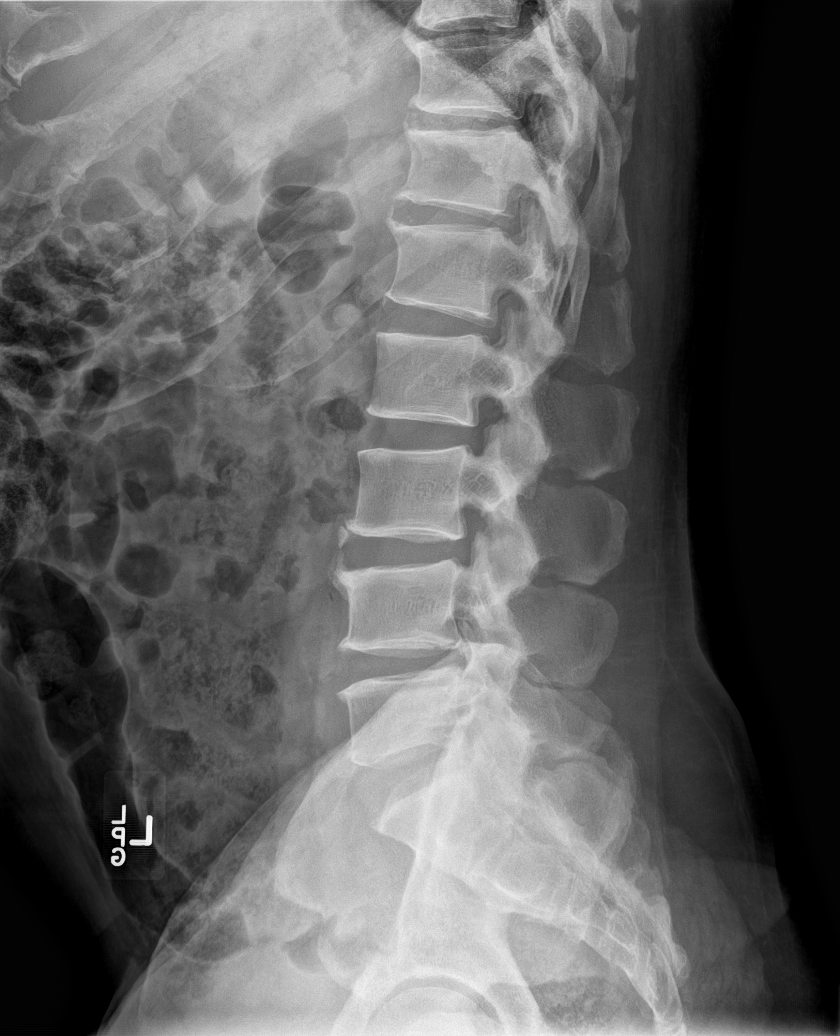

[l-spine spot]
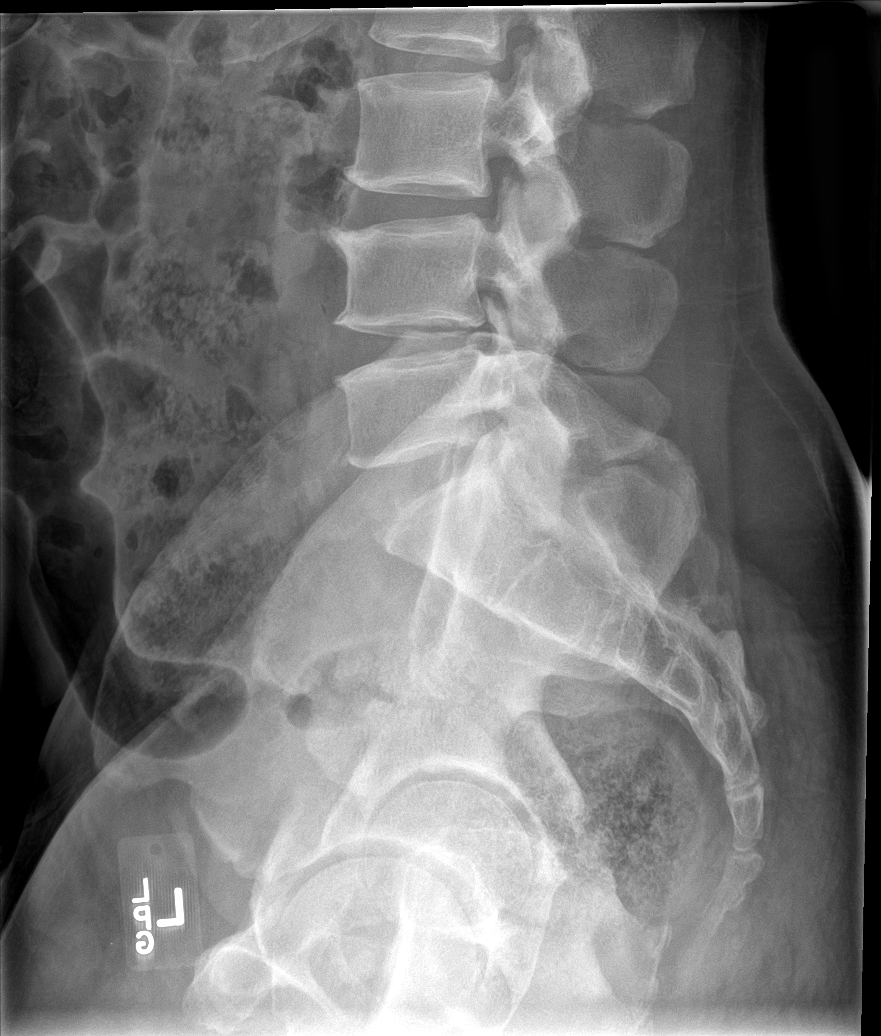

[5 of 5 positions shown; findings below may reference images not displayed]

FINDINGS: There is no evidence of lumbar spine fracture. Alignment is normal.
Intervertebral disc spaces are maintained. Mild anterior osteophyte
formation is noted at L3-4 and L4-5.
IMPRESSION: Mild multilevel degenerative changes. No acute abnormality is noted.

## 2022-12-21 ENCOUNTER — Other Ambulatory Visit: Payer: Self-pay | Admitting: Emergency Medicine

## 2022-12-21 DIAGNOSIS — N529 Male erectile dysfunction, unspecified: Secondary | ICD-10-CM

## 2023-04-27 ENCOUNTER — Emergency Department (HOSPITAL_COMMUNITY)

## 2023-04-27 ENCOUNTER — Other Ambulatory Visit: Payer: Self-pay

## 2023-04-27 ENCOUNTER — Emergency Department (HOSPITAL_COMMUNITY)
Admission: EM | Admit: 2023-04-27 | Discharge: 2023-04-27 | Disposition: A | Discharge status: Home / Self Care | Attending: Emergency Medicine | Admitting: Emergency Medicine

## 2023-04-27 ENCOUNTER — Encounter (HOSPITAL_COMMUNITY): Payer: Self-pay

## 2023-04-27 DIAGNOSIS — M25512 Pain in left shoulder: Secondary | ICD-10-CM | POA: Diagnosis present

## 2023-04-27 DIAGNOSIS — R946 Abnormal results of thyroid function studies: Secondary | ICD-10-CM | POA: Diagnosis not present

## 2023-04-27 DIAGNOSIS — Z79899 Other long term (current) drug therapy: Secondary | ICD-10-CM | POA: Diagnosis not present

## 2023-04-27 DIAGNOSIS — D649 Anemia, unspecified: Secondary | ICD-10-CM | POA: Diagnosis not present

## 2023-04-27 DIAGNOSIS — E039 Hypothyroidism, unspecified: Secondary | ICD-10-CM | POA: Diagnosis not present

## 2023-04-27 DIAGNOSIS — E890 Postprocedural hypothyroidism: Secondary | ICD-10-CM

## 2023-04-27 DIAGNOSIS — I10 Essential (primary) hypertension: Secondary | ICD-10-CM | POA: Diagnosis not present

## 2023-04-27 DIAGNOSIS — R7989 Other specified abnormal findings of blood chemistry: Secondary | ICD-10-CM

## 2023-04-27 DIAGNOSIS — W010XXA Fall on same level from slipping, tripping and stumbling without subsequent striking against object, initial encounter: Secondary | ICD-10-CM | POA: Diagnosis not present

## 2023-04-27 DIAGNOSIS — M25511 Pain in right shoulder: Secondary | ICD-10-CM

## 2023-04-27 DIAGNOSIS — Y9302 Activity, running: Secondary | ICD-10-CM | POA: Diagnosis not present

## 2023-04-27 DIAGNOSIS — W19XXXA Unspecified fall, initial encounter: Secondary | ICD-10-CM

## 2023-04-27 LAB — COMPREHENSIVE METABOLIC PANEL WITH GFR
ALT: 33 U/L (ref 0–44)
AST: 70 U/L — ABNORMAL HIGH (ref 15–41)
Albumin: 4 g/dL (ref 3.5–5.0)
Alkaline Phosphatase: 25 U/L — ABNORMAL LOW (ref 38–126)
Anion gap: 9 (ref 5–15)
BUN: 16 mg/dL (ref 6–20)
CO2: 26 mmol/L (ref 22–32)
Calcium: 9 mg/dL (ref 8.9–10.3)
Chloride: 104 mmol/L (ref 98–111)
Creatinine, Ser: 1.23 mg/dL (ref 0.61–1.24)
GFR, Estimated: >60 mL/min (ref >60)
Glucose, Bld: 73 mg/dL (ref 70–99)
Potassium: 3.8 mmol/L (ref 3.5–5.1)
Sodium: 139 mmol/L (ref 135–145)
Total Bilirubin: 1.7 mg/dL — ABNORMAL HIGH (ref 0.0–1.2)
Total Protein: 6.8 g/dL (ref 6.5–8.1)

## 2023-04-27 LAB — CBC WITH DIFFERENTIAL/PLATELET
Abs Immature Granulocytes: 0.01 10*3/uL (ref 0.00–0.07)
Basophils Absolute: 0.1 10*3/uL (ref 0.0–0.1)
Basophils Relative: 2 %
Eosinophils Absolute: 0 10*3/uL (ref 0.0–0.5)
Eosinophils Relative: 1 %
HCT: 28.2 % — ABNORMAL LOW (ref 39.0–52.0)
Hemoglobin: 9.3 g/dL — ABNORMAL LOW (ref 13.0–17.0)
Immature Granulocytes: 0 %
Lymphocytes Relative: 20 %
Lymphs Abs: 0.8 10*3/uL (ref 0.7–4.0)
MCH: 33 pg (ref 26.0–34.0)
MCHC: 33 g/dL (ref 30.0–36.0)
MCV: 100 fL (ref 80.0–100.0)
Monocytes Absolute: 0.3 10*3/uL (ref 0.1–1.0)
Monocytes Relative: 8 %
Neutro Abs: 2.7 10*3/uL (ref 1.7–7.7)
Neutrophils Relative %: 69 %
Platelets: 241 10*3/uL (ref 150–400)
RBC: 2.82 MIL/uL — ABNORMAL LOW (ref 4.22–5.81)
RDW: 12 % (ref 11.5–15.5)
WBC: 3.9 10*3/uL — ABNORMAL LOW (ref 4.0–10.5)
nRBC: 0 % (ref 0.0–0.2)

## 2023-04-27 LAB — TSH: TSH: 59.889 u[IU]/mL — ABNORMAL HIGH (ref 0.350–4.500)

## 2023-04-27 LAB — POC OCCULT BLOOD, ED: Fecal Occult Bld: NEGATIVE

## 2023-04-27 MED ORDER — LEVOTHYROXINE SODIUM 200 MCG PO TABS: 200.0000 ug | ORAL_TABLET | Freq: Every day | ORAL | 0 refills | Status: AC

## 2023-04-27 MED ORDER — FLUTICASONE PROPIONATE 50 MCG/ACT NA SUSP
2.0000 | Freq: Once | NASAL | Status: DC
  Filled 2023-04-27: qty 16

## 2023-04-27 MED ORDER — ACETAMINOPHEN 500 MG PO TABS
1000.0000 mg | ORAL_TABLET | Freq: Once | ORAL | Status: DC
  Filled 2023-04-27: qty 2

## 2023-04-27 NOTE — Progress Notes (Signed)
 Orthopedic Tech Progress Note Patient Details:  Steven Morales 03-Apr-1965 [id number]  Ortho Devices Type of Ortho Device: Arm sling Ortho Device/Splint Location: For LUE, at bedside Ortho Device/Splint Interventions: Ordered   Post Interventions Instructions Provided: Care of device, Adjustment of device  [name] Carmine Savoy 04/27/2023, 5:01 PM

## 2023-04-27 NOTE — ED Provider Notes (Addendum)
 [place] EMERGENCY DEPARTMENT AT [facility] Provider Note   CSN: [id number] Arrival date & time: 04/27/23  1405     History  Chief Complaint  Patient presents with   Fall   Near Syncope    Steven Morales is a 58 y.o. male with history of bradycardia, hypertension, hypothyroidism, presents for concern of pain in his left shoulder.  States last night he was running up a hill, tripped, and then fell onto his left shoulder.  He denies hitting his head or any loss of consciousness.  Denies any chest pain, dizziness, or shortness of breath before or after the fall.  He reports difficulty lifting his shoulder on the left side on his own due to pain.  Denies any numbness or tingling in the hands.  He denies pain anywhere else.  He also reports nasal congestion ongoing for the past couple of days and watery eyes.  Does report history of seasonal allergies.  Denies any cough, sore throat, fever or chills.   Fall  Near Syncope       Home Medications Prior to Admission medications   Medication Sig Start Date End Date Taking? Authorizing Provider  cetirizine (ZYRTEC) 10 MG tablet  10/03/15   [provider]  Cholecalciferol (VITAMIN D-3) 1000 UNITS CAPS Take 1 capsule by mouth daily. Reported on 08/09/2015    [provider]  Cholecalciferol (VITAMIN D3) 1.25 MG (50000 UT) CAPS Take 1 capsule by mouth once a week. 02/29/20   [provider]  clotrimazole-betamethasone (LOTRISONE) cream Apply 1 application topically 2 (two) times daily. 02/11/20   Peyton Najjar, MD  fluticasone (FLONASE) 50 MCG/ACT nasal spray Place 2 sprays into both nostrils daily. 02/11/20   Peyton Najjar, MD  FLUTICASONE-SALMETEROL IN Inhale into the lungs as needed.    [provider]  furosemide (LASIX) 40 MG tablet Take 40 mg by mouth as needed. 10/03/15   [provider]  hydrochlorothiazide (HYDRODIURIL) 25 MG tablet  10/03/15   [provider]  ibuprofen  (ADVIL,MOTRIN) 600 MG tablet Take 1 tablet (600 mg total) by mouth every 6 (six) hours as needed. 08/17/16   Nira Conn, MD  levothyroxine (SYNTHROID) 200 MCG tablet Take 1 tablet (200 mcg total) by mouth daily before breakfast. 11/12/20   Romero Belling, MD  losartan (COZAAR) 50 MG tablet Take 2 tablets (100 mg total) by mouth daily. 02/17/20   Turner, Cornelious Bryant, MD  ROCKLATAN 0.02-0.005 % SOLN Apply 1 drop to eye daily. 02/17/20   [provider]  sildenafil (VIAGRA) 100 MG tablet Take 0.5-1 tablets (50-100 mg total) by mouth daily as needed for erectile dysfunction. 12/22/22   Georgina Quint, MD  timolol (TIMOPTIC) 0.5 % ophthalmic solution Place 1 drop into both eyes every morning. 02/17/20   [provider]      Allergies    Patient has no known allergies.    Review of Systems   Review of Systems  Cardiovascular:  Positive for near-syncope.    Physical Exam Updated Vital Signs BP (!) 143/91   Pulse (!) 48   Temp 98 F (36.7 C) (Oral)   Resp 10   Ht 6' (1.829 m)   SpO2 90%   BMI 27.50 kg/m  Physical Exam Vitals and nursing note reviewed. Exam conducted with a chaperone present.  Constitutional:      General: He is not in acute distress.    Appearance: He is well-developed.  HENT:  Head: Normocephalic and atraumatic.     Comments: Watery eyes    Nose: Congestion present.     Mouth/Throat:     Pharynx: No oropharyngeal exudate or posterior oropharyngeal erythema.  Eyes:     Extraocular Movements: Extraocular movements intact.     Conjunctiva/sclera: Conjunctivae normal.     Pupils: Pupils are equal, round, and reactive to light.  Cardiovascular:     Rate and Rhythm: Regular rhythm. Bradycardia present.     Heart sounds: No murmur heard. Pulmonary:     Effort: Pulmonary effort is normal. No respiratory distress.     Breath sounds: Normal breath sounds.  Abdominal:     Palpations: Abdomen is soft.     Tenderness: There is no abdominal  tenderness.  Genitourinary:    Comments: RN Delorise Royals present to chaperone rectal exam  No external or internal hemorrhoids noted.  Stool brown appearing.  No gross blood or melanotic stool. Musculoskeletal:        General: No swelling.     Cervical back: Neck supple.     Comments: Left upper extremity:  General No obvious deformity. No erythema, edema, contusions, open wounds   Palpation Tender diffusely along the humeral head and distal humerus.  Non-tender to palpation along the clavicle, AC joint, Non-tender to palpation over the scapular ridge. Non-tender to the radius, ulna, carpal bones diffusely, hand or fingers diffusely in the LUE.  ROM Unable to fully abduct left shoulder overhead, can abduct to approximately 45 degrees. Able to perform internal and external rotation of left shoulder. Full flexion and extension at left elbow and wrist   Sensation: Sensation intact throughout the upper extremity    No pain with palpation diffusely of the right upper extremity, bilateral lower extremities. Able to ambulate without difficulty.   No tenderness to palpation of the cervical, thoracic, or lumbar spine, or chest wall diffusely    Skin:    General: Skin is warm and dry.     Capillary Refill: Capillary refill takes less than 2 seconds.  Neurological:     General: No focal deficit present.     Mental Status: He is alert and oriented to person, place, and time.  Psychiatric:        Mood and Affect: Mood normal.     ED Results / Procedures / Treatments   Labs (all labs ordered are listed, but only abnormal results are displayed) Labs Reviewed  CBC WITH DIFFERENTIAL/PLATELET - Abnormal; Notable for the following components:      Result Value   WBC 3.9 (*)    RBC 2.82 (*)    Hemoglobin 9.3 (*)    HCT 28.2 (*)    All other components within normal limits  COMPREHENSIVE METABOLIC PANEL WITH GFR - Abnormal; Notable for the following components:   AST 70 (*)     Alkaline Phosphatase 25 (*)    Total Bilirubin 1.7 (*)    All other components within normal limits  TSH - Abnormal; Notable for the following components:   TSH 59.889 (*)    All other components within normal limits  POC OCCULT BLOOD, ED    EKG None  Radiology DG Humerus Left Result Date: 04/27/2023 CLINICAL DATA:  Left shoulder pain after fall. EXAM: LEFT HUMERUS - 2+ VIEW COMPARISON:  None Available. FINDINGS: There is no evidence of fracture or other focal bone lesions. Soft tissues are unremarkable. IMPRESSION: Negative. Electronically Signed   By: Lupita Raider M.D.   On: 04/27/2023  15:18   DG Shoulder Left Result Date: 04/27/2023 CLINICAL DATA:  Left shoulder pain after fall. EXAM: LEFT SHOULDER - 2+ VIEW COMPARISON:  Jun 23, 2009. FINDINGS: There is no evidence of fracture or dislocation. Moderate degenerative changes seen involving the left acromioclavicular joint. Soft tissues are unremarkable. 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 No acute abnormality seen. Electronically Signed   By: Lupita Raider M.D.   On: 04/27/2023 15:17    Procedures Procedures    Medications Ordered in ED Medications  acetaminophen (TYLENOL) tablet 1,000 mg (1,000 mg Oral Not Given 04/27/23 1523)  fluticasone (FLONASE) 50 MCG/ACT nasal spray 2 spray (2 sprays Each Nare Not Given 04/27/23 1636)    ED Course/ Medical Decision Making/ A&P Clinical Course as of 04/27/23 1643  Sat Apr 27, 2023  1530 Patient's hemoglobin resulted low at 9.3.  He does not have a recent baseline.  Denies any bloody or melanotic stools.  Will obtain Hemoccult. [AF]    Clinical Course User Index [AF] Arabella Merles, PA-C                                 Medical Decision Making Amount and/or Complexity of Data Reviewed Labs: ordered. Radiology: ordered.  Risk OTC drugs. Prescription drug management.     Differential diagnosis includes but is not limited to  fracture, dislocation, sprain, syncope, orthostatic hypotension, hypoglycemia, arrhythmia, ACS, allergic rhinitis, pneumonia, URI   ED Course:  Patient well-appearing, stable vital signs aside from a slightly elevated blood pressure 145/87, and bradycardia at 54.  Upon chart review, it appears he has bradycardia at baseline that has been ongoing for several years.  Patient with diffuse tenderness to palpation of the left humeral head and distal humerus.  Has difficulty with abduction of the left shoulder.  X-ray of the left shoulder and left humerus are obtained which showed no acute fracture or dislocation.  Suspect patient has sustained a muscle strain versus sprain which is causing his pain.  He was placed in a shoulder sling, will have him follow-up with orthopedics.  He also reports nasal congestion and watery eyes over the past couple days, no cough, fever, sore throat, no concern for pneumonia or URI.  Consistent with allergic rhinitis.  I ordered patient Tylenol for pain.  However, he declines as he is fasting for religious reasons I Ordered, and personally interpreted labs.  The pertinent results include:   CBC with neutropenia, low RBC, and low hemoglobin at 9.3.  Most recent CBC was from 6 years ago with hemoglobin of 12, unsure what recent baseline is. Hemoccult negative CMP with slight elevation in AST at 70, slight elevation in T. bili at 1.7.  No other electrolyte abnormalities, and elevation creatinine TSH elevated at 59 Low concern for any cardiac etiology as to his fall as he denies any chest pain, shortness of breath, or palpitations, EKG with baseline sinus bradycardia and no ST changes. CBC does have a low hemoglobin at 9.3, but unknown what patient's recent baseline is. Hemoccult negative, low concern for GI bleed. Patient without abdominal pain or ecchymosis, low concern for acute intraabdominal bleed. He did not hit his head, no headache, no nausea or vomiting, low concern for any  acute intracranial abnormality.   TSH elevated at 59 but patient appears asymptomatic.  Patient states he stopped taking his levothyroxine.  I discussed that he needs to follow-up with his endocrinologist  within the next 2 weeks to discuss getting back on this medication.   Patient stable and appropriate for discharge home  Imaging Studies ordered: I ordered imaging studies including x-ray left humerus, x-ray left shoulder I independently visualized the imaging with scope of interpretation limited to determining acute life threatening conditions related to emergency care. Imaging showed no acute abnormality I agree with the radiologist interpretation   Cardiac Monitoring: / EKG: The patient was maintained on a cardiac monitor.  I personally viewed and interpreted the cardiac monitored which showed an underlying rhythm of: Sinus bradycardia, no ST changes   Impression: Mechanical Fall Left shoulder pain, likely sprain vs strain Hypothyroidism, not well-controlled Seasonal allergies  Disposition:  The patient was discharged home with instructions to wear shoulder sling as needed for comfort, and to wean out of this as tolerated.  Follow-up with orthopedics within the next week.  May use Tylenol and ibuprofen as needed for pain.  He needs to follow-up with his endocrinologist within the next 2 weeks regarding his poorly controlled hypothyroidism.  Follow-up with PCP within the next 2 weeks for repeat CBC to closely monitor his abnormal blood counts today. May use Flonase and zyrtec for seasonal allergies. Return precautions given.  Addendum 5:11 PM.  I have also sent in a refill of patient's levothyroxine to his pharmacy.  He understands he needs to start taking this as prescribed and follow-up with his endocrinologist within the next 2 weeks for recheck of his TSH and further medication management.  Record Review: External records from outside source obtained and reviewed including office  visit notes reviewed, has had bradycardia in the 40's and 50's for at least 6 years     This chart was dictated using voice recognition software, Dragon. Despite the best efforts of this provider to proofread and correct errors, errors may still occur which can change documentation meaning.          Final Clinical Impression(s) / ED Diagnoses Final diagnoses:  Low hemoglobin  Fall, initial encounter  Acute pain of right shoulder  High serum thyroid stimulating hormone (TSH)    Rx / DC Orders ED Discharge Orders     None         Arabella Merles, PA-C 04/27/23 1643    Arabella Merles, PA-C 04/27/23 1711    Lonell Grandchild, MD 04/27/23 1820

## 2023-04-27 NOTE — ED Triage Notes (Signed)
 Pt had a fall last night, landing on his left shoulder. Pt c.o left shoulder pain and left upper back pain and neck pain, pt ambulatory.

## 2023-04-27 NOTE — Discharge Instructions (Addendum)
 Your x-ray did not show any signs of a fracture or dislocation in your left shoulder.  You likely have a sprain of your rotator cuff muscle.  You have been provided a shoulder sling to use as needed for comfort.  Please wean out of this as tolerated to prevent stiffness of your left shoulder.  Perform gentle range of motion exercises of the left shoulder as tolerated.  Please follow-up with the orthopedic office listed below, or an orthopedic office with your choice, within the next week regarding your left shoulder.   You may take up to 1000mg  of tylenol every 6 hours as needed for pain.  Do not take more then 4g per day.  You may use up to 600mg  ibuprofen every 6 hours as needed for pain.  Do not exceed 2.4g of ibuprofen per day.  Your lab work showed that you had a low hemoglobin which is one of your blood counts.  Your white blood cell count and red blood cell count was also low.  Please have your PCP repeat this lab (CBC) and monitor these values closely within the next 2 weeks.  There were no signs of a gastrointestinal bleed from your stool sample.  Your thyroid function test (TSH) was abnormally high today (normal TSH between 0.350 and 4.50, your TSH was at 59.889).  This means your hypothyroidism is not well-controlled.  You need to follow-up with your endocrinologist within the next 2 weeks regarding this to make medication adjustments to your levothyroxine.  You may use 2 puffs of Flonase (fluticasone) nasal spray in each nostril daily to help with nasal congestion.  This is an over-the-counter medication you can obtain at any drugstore.  I would also recommend using 1 tablet (10 mg) of cetirizine (Zyrtec) daily to help with allergy symptoms.  This is an over-the-counter medication you can obtain at any drugstore.  Return to the ER for any severe headache not controlled with Tylenol, repetitive vomiting, severe abdominal pain, loss of consciousness, any other new or concerning symptoms.

## 2023-06-02 ENCOUNTER — Emergency Department (HOSPITAL_COMMUNITY)

## 2023-06-02 ENCOUNTER — Other Ambulatory Visit: Payer: Self-pay

## 2023-06-02 ENCOUNTER — Encounter (HOSPITAL_COMMUNITY): Payer: Self-pay

## 2023-06-02 ENCOUNTER — Emergency Department (HOSPITAL_COMMUNITY)
Admission: EM | Admit: 2023-06-02 | Discharge: 2023-06-02 | Disposition: A | Discharge status: Home / Self Care | Attending: Emergency Medicine | Admitting: Emergency Medicine

## 2023-06-02 DIAGNOSIS — Y990 Civilian activity done for income or pay: Secondary | ICD-10-CM | POA: Diagnosis not present

## 2023-06-02 DIAGNOSIS — R42 Dizziness and giddiness: Secondary | ICD-10-CM | POA: Diagnosis not present

## 2023-06-02 DIAGNOSIS — R6884 Jaw pain: Secondary | ICD-10-CM | POA: Insufficient documentation

## 2023-06-02 DIAGNOSIS — M25512 Pain in left shoulder: Secondary | ICD-10-CM | POA: Diagnosis not present

## 2023-06-02 DIAGNOSIS — R519 Headache, unspecified: Secondary | ICD-10-CM | POA: Insufficient documentation

## 2023-06-02 LAB — CBC WITH DIFFERENTIAL/PLATELET
Abs Immature Granulocytes: 0.02 10*3/uL (ref 0.00–0.07)
Basophils Absolute: 0.1 10*3/uL (ref 0.0–0.1)
Basophils Relative: 1 %
Eosinophils Absolute: 0 10*3/uL (ref 0.0–0.5)
Eosinophils Relative: 0 %
HCT: 32.5 % — ABNORMAL LOW (ref 39.0–52.0)
Hemoglobin: 10.7 g/dL — ABNORMAL LOW (ref 13.0–17.0)
Immature Granulocytes: 0 %
Lymphocytes Relative: 20 %
Lymphs Abs: 1.2 10*3/uL (ref 0.7–4.0)
MCH: 32.8 pg (ref 26.0–34.0)
MCHC: 32.9 g/dL (ref 30.0–36.0)
MCV: 99.7 fL (ref 80.0–100.0)
Monocytes Absolute: 0.3 10*3/uL (ref 0.1–1.0)
Monocytes Relative: 5 %
Neutro Abs: 4.4 10*3/uL (ref 1.7–7.7)
Neutrophils Relative %: 74 %
Platelets: 271 10*3/uL (ref 150–400)
RBC: 3.26 MIL/uL — ABNORMAL LOW (ref 4.22–5.81)
RDW: 11.9 % (ref 11.5–15.5)
WBC: 5.9 10*3/uL (ref 4.0–10.5)
nRBC: 0 % (ref 0.0–0.2)

## 2023-06-02 LAB — BASIC METABOLIC PANEL WITH GFR
Anion gap: 12 (ref 5–15)
BUN: 17 mg/dL (ref 6–20)
CO2: 26 mmol/L (ref 22–32)
Calcium: 10.1 mg/dL (ref 8.9–10.3)
Chloride: 102 mmol/L (ref 98–111)
Creatinine, Ser: 1.25 mg/dL — ABNORMAL HIGH (ref 0.61–1.24)
GFR, Estimated: >60 mL/min (ref >60)
Glucose, Bld: 97 mg/dL (ref 70–99)
Potassium: 3.3 mmol/L — ABNORMAL LOW (ref 3.5–5.1)
Sodium: 140 mmol/L (ref 135–145)

## 2023-06-02 MED ORDER — OXYCODONE-ACETAMINOPHEN 5-325 MG PO TABS: 1.0000 | ORAL_TABLET | Freq: Three times a day (TID) | ORAL | 0 refills | Status: AC | PRN

## 2023-06-02 MED ORDER — IBUPROFEN 400 MG PO TABS
400.0000 mg | ORAL_TABLET | Freq: Once | ORAL | Status: AC | PRN
Start: 2023-06-02 — End: 2023-06-02
  Administered 2023-06-02: 400 mg via ORAL
  Filled 2023-06-02: qty 1

## 2023-06-02 MED ORDER — NAPROXEN 500 MG PO TABS: 500.0000 mg | ORAL_TABLET | Freq: Two times a day (BID) | ORAL | 0 refills | Status: AC

## 2023-06-02 MED ORDER — IOHEXOL 350 MG/ML SOLN
75.0000 mL | Freq: Once | INTRAVENOUS | Status: AC | PRN
  Administered 2023-06-02: 75 mL via INTRAVENOUS

## 2023-06-02 MED ORDER — OXYCODONE HCL 5 MG PO TABS
5.0000 mg | ORAL_TABLET | Freq: Once | ORAL | Status: AC
  Administered 2023-06-02: 5 mg via ORAL
  Filled 2023-06-02: qty 1
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
## 2023-06-02 NOTE — ED Notes (Signed)
 Patient transported to CT scan .
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
## 2023-06-03 ENCOUNTER — Other Ambulatory Visit: Payer: Self-pay | Admitting: Emergency Medicine

## 2023-06-03 DIAGNOSIS — N529 Male erectile dysfunction, unspecified: Secondary | ICD-10-CM
